# Patient Record
Sex: Male | Born: 1955 | Race: White | Hispanic: No | Smoking: Former smoker
Health system: Southern US, Community
[De-identification: ages and names within clinical notes are randomized; demographics above are authoritative.]

## PROBLEM LIST (undated history)

## (undated) DIAGNOSIS — E119 Type 2 diabetes mellitus without complications: Secondary | ICD-10-CM

## (undated) DIAGNOSIS — R17 Unspecified jaundice: Secondary | ICD-10-CM

## (undated) DIAGNOSIS — R600 Localized edema: Secondary | ICD-10-CM

## (undated) HISTORY — DX: Unspecified jaundice: R17

## (undated) HISTORY — DX: Type 2 diabetes mellitus without complications: E11.9

## (undated) HISTORY — DX: Localized edema: R60.0

## (undated) HISTORY — PX: OTHER SURGICAL HISTORY: SHX169

---

## 2016-05-21 ENCOUNTER — Encounter: Payer: Self-pay | Admitting: Family Medicine

## 2016-05-21 ENCOUNTER — Ambulatory Visit: Payer: Self-pay | Admitting: Family Medicine

## 2016-05-21 ENCOUNTER — Ambulatory Visit (INDEPENDENT_AMBULATORY_CARE_PROVIDER_SITE_OTHER): Payer: Self-pay | Admitting: Family Medicine

## 2016-05-21 VITALS — BP 168/100 | HR 115 | Temp 98.3°F | Ht 68.0 in | Wt 201.2 lb

## 2016-05-21 DIAGNOSIS — R7309 Other abnormal glucose: Secondary | ICD-10-CM

## 2016-05-21 DIAGNOSIS — I10 Essential (primary) hypertension: Secondary | ICD-10-CM

## 2016-05-21 DIAGNOSIS — E1121 Type 2 diabetes mellitus with diabetic nephropathy: Secondary | ICD-10-CM

## 2016-05-21 DIAGNOSIS — F1020 Alcohol dependence, uncomplicated: Secondary | ICD-10-CM

## 2016-05-21 DIAGNOSIS — E1165 Type 2 diabetes mellitus with hyperglycemia: Secondary | ICD-10-CM

## 2016-05-21 DIAGNOSIS — IMO0002 Reserved for concepts with insufficient information to code with codable children: Secondary | ICD-10-CM

## 2016-05-21 DIAGNOSIS — R000 Tachycardia, unspecified: Secondary | ICD-10-CM

## 2016-05-21 LAB — URINALYSIS
Bilirubin, UA: NEGATIVE
Glucose, UA: NEGATIVE
LEUKOCYTES UA: NEGATIVE
Nitrite, UA: NEGATIVE
PH UA: 5.5 (ref 5.0–7.5)
Specific Gravity, UA: 1.02 (ref 1.005–1.030)
Urobilinogen, Ur: 1 mg/dL (ref 0.2–1.0)

## 2016-05-21 LAB — BAYER DCA HB A1C WAIVED: HB A1C (BAYER DCA - WAIVED): 8.4 % — ABNORMAL HIGH (ref <7.0)

## 2016-05-21 MED ORDER — METOPROLOL SUCCINATE ER 50 MG PO TB24: 50.0000 mg | ORAL_TABLET | Freq: Every day | ORAL | 3 refills | Status: DC

## 2016-05-21 MED ORDER — METFORMIN HCL 500 MG PO TABS: 500.0000 mg | ORAL_TABLET | Freq: Two times a day (BID) | ORAL | 3 refills | Status: DC

## 2016-05-21 MED ORDER — GLYBURIDE 2.5 MG PO TABS: 2.5000 mg | ORAL_TABLET | Freq: Every day | ORAL | 2 refills | Status: DC
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
## 2016-05-21 NOTE — Patient Instructions (Signed)

## 2016-05-22 ENCOUNTER — Other Ambulatory Visit: Payer: Self-pay | Admitting: Family Medicine

## 2016-05-22 LAB — CMP14+EGFR
A/G RATIO: 1.7 (ref 1.2–2.2)
ALBUMIN: 4.3 g/dL (ref 3.5–5.5)
ALK PHOS: 113 IU/L (ref 39–117)
ALT: 30 IU/L (ref 0–44)
AST: 24 IU/L (ref 0–40)
BUN / CREAT RATIO: 8 — AB (ref 9–20)
BUN: 8 mg/dL (ref 6–24)
Bilirubin Total: 4.5 mg/dL — ABNORMAL HIGH (ref 0.0–1.2)
CALCIUM: 9.7 mg/dL (ref 8.7–10.2)
CO2: 23 mmol/L (ref 18–29)
CREATININE: 1.03 mg/dL (ref 0.76–1.27)
Chloride: 95 mmol/L — ABNORMAL LOW (ref 96–106)
GFR calc Af Amer: 92 mL/min/{1.73_m2} (ref >59)
GFR, EST NON AFRICAN AMERICAN: 80 mL/min/{1.73_m2} (ref >59)
GLOBULIN, TOTAL: 2.6 g/dL (ref 1.5–4.5)
Glucose: 136 mg/dL — ABNORMAL HIGH (ref 65–99)
POTASSIUM: 5.3 mmol/L — AB (ref 3.5–5.2)
SODIUM: 140 mmol/L (ref 134–144)
Total Protein: 6.9 g/dL (ref 6.0–8.5)

## 2016-05-22 LAB — CBC WITH DIFFERENTIAL/PLATELET
BASOS: 1 %
Basophils Absolute: 0.1 10*3/uL (ref 0.0–0.2)
EOS (ABSOLUTE): 0.1 10*3/uL (ref 0.0–0.4)
EOS: 1 %
HEMATOCRIT: 49.7 % (ref 37.5–51.0)
HEMOGLOBIN: 17.4 g/dL (ref 12.6–17.7)
Immature Grans (Abs): 0 10*3/uL (ref 0.0–0.1)
Immature Granulocytes: 0 %
LYMPHS ABS: 3 10*3/uL (ref 0.7–3.1)
Lymphs: 29 %
MCH: 32.4 pg (ref 26.6–33.0)
MCHC: 35 g/dL (ref 31.5–35.7)
MCV: 93 fL (ref 79–97)
MONOCYTES: 9 %
MONOS ABS: 0.9 10*3/uL (ref 0.1–0.9)
Neutrophils Absolute: 6.3 10*3/uL (ref 1.4–7.0)
Neutrophils: 60 %
Platelets: 258 10*3/uL (ref 150–379)
RBC: 5.37 x10E6/uL (ref 4.14–5.80)
RDW: 13.3 % (ref 12.3–15.4)
WBC: 10.3 10*3/uL (ref 3.4–10.8)

## 2016-05-26 ENCOUNTER — Telehealth: Payer: Self-pay | Admitting: Family Medicine

## 2016-05-26 NOTE — Telephone Encounter (Signed)
Patient aware and orders placed.    

## 2016-05-28 ENCOUNTER — Telehealth: Payer: Self-pay | Admitting: Family Medicine

## 2016-05-28 NOTE — Telephone Encounter (Signed)
He should check his blood sugar levels when his hands are tingling, it can be a sign of either low or high blood sugar levels. The numbers he mentioned (103, 118) were good, sometimes when the body is used to higher blood sugar levels you can have symptoms of lower blood sugar level such as the tingling. I think it is fine for him to continue his medications for now, if the tingling is getting worse he should let us know. I will forward to his PCP Dr. Darlyn ReadStacks who is out today who may have other thoughts.

## 2016-05-28 NOTE — Telephone Encounter (Signed)
Patient reports that his blood sugars have been good, 118 yesterday, 103 this morning.  The swelling in his legs has gotten better, still some swelling in feet.  He is concerned because he is experiencing tingling in both hands and is worried one of the medications may be causing this.  He states he is hesitant to take the medicines because of this and would like to know what is recommended, please advise.

## 2016-05-28 NOTE — Telephone Encounter (Signed)
Explained to patient that one of his liver enzymes were elevated and that Dr. Darlyn ReadStacks recommended the ultrasound to look at the liver and make sure it was okay.

## 2016-05-28 NOTE — Telephone Encounter (Signed)
Spoke with pt regarding Dr Vincent's recommendation Pt verbalizes understanding

## 2016-05-30 ENCOUNTER — Ambulatory Visit (HOSPITAL_COMMUNITY): Payer: Self-pay

## 2016-06-01 ENCOUNTER — Encounter: Payer: Self-pay | Admitting: Family Medicine

## 2016-06-01 ENCOUNTER — Telehealth: Payer: Self-pay | Admitting: Family Medicine

## 2016-06-01 ENCOUNTER — Other Ambulatory Visit: Payer: Self-pay | Admitting: *Deleted

## 2016-06-01 ENCOUNTER — Other Ambulatory Visit: Payer: Self-pay | Admitting: Family Medicine

## 2016-06-01 ENCOUNTER — Ambulatory Visit (HOSPITAL_COMMUNITY)
Admission: RE | Admit: 2016-06-01 | Discharge: 2016-06-01 | Disposition: A | Discharge status: Home / Self Care | Payer: Self-pay | Source: Ambulatory Visit | Attending: Family Medicine | Admitting: Family Medicine

## 2016-06-01 ENCOUNTER — Ambulatory Visit (INDEPENDENT_AMBULATORY_CARE_PROVIDER_SITE_OTHER): Payer: Self-pay | Admitting: Family Medicine

## 2016-06-01 VITALS — BP 138/93 | HR 95 | Temp 97.5°F | Ht 68.0 in | Wt 205.2 lb

## 2016-06-01 DIAGNOSIS — K8001 Calculus of gallbladder with acute cholecystitis with obstruction: Secondary | ICD-10-CM

## 2016-06-01 DIAGNOSIS — R188 Other ascites: Secondary | ICD-10-CM | POA: Insufficient documentation

## 2016-06-01 DIAGNOSIS — K8020 Calculus of gallbladder without cholecystitis without obstruction: Secondary | ICD-10-CM | POA: Insufficient documentation

## 2016-06-01 DIAGNOSIS — IMO0002 Reserved for concepts with insufficient information to code with codable children: Secondary | ICD-10-CM

## 2016-06-01 DIAGNOSIS — E1165 Type 2 diabetes mellitus with hyperglycemia: Secondary | ICD-10-CM

## 2016-06-01 DIAGNOSIS — J0100 Acute maxillary sinusitis, unspecified: Secondary | ICD-10-CM

## 2016-06-01 DIAGNOSIS — J90 Pleural effusion, not elsewhere classified: Secondary | ICD-10-CM | POA: Insufficient documentation

## 2016-06-01 DIAGNOSIS — E1121 Type 2 diabetes mellitus with diabetic nephropathy: Secondary | ICD-10-CM

## 2016-06-01 MED ORDER — AMOXICILLIN-POT CLAVULANATE 875-125 MG PO TABS: 1.0000 | ORAL_TABLET | Freq: Two times a day (BID) | ORAL | 0 refills | Status: DC

## 2016-06-01 NOTE — Progress Notes (Signed)
Subjective:  Patient ID: Shawn Malone, male    DOB: 12-Jul-1956  Age: 60 y.o. MRN: [id number]  CC: Diabetes (pt here today for a 2 week recheck, he had ultrasound earlier this morning)   HPI Shawn Sihomas Gidley presents for recheck of the diabetes. Had diarrhea for three days after starting metformin. Now doing well. Still having some abd discomfort. Ultrasound showed gallstones with some thickening of the gall bladder wall.  Symptoms include congestion, facial pain, nasal congestion, no  fever, non productive cough, post nasal drip and sinus pressure with no fever, chills, night sweats or weight loss. Onset of symptoms was a few days ago, gradually worsening since that time. Pt.is drinking moderate amounts of fluids.      History Shawn Malone has no past medical history on file.   He has no past surgical history on file.   His family history includes Cancer in his brother and mother.He reports that he quit smoking about 33 years ago. He has never used smokeless tobacco. He reports that he drinks about 18.0 oz of alcohol per week . He reports that he does not use drugs.    ROS Review of Systems  Constitutional: Negative for chills, diaphoresis and fever.  HENT: Negative for rhinorrhea and sore throat.   Respiratory: Negative for cough and shortness of breath.   Cardiovascular: Negative for chest pain.  Gastrointestinal: Negative for abdominal pain.  Musculoskeletal: Negative for arthralgias and myalgias.  Skin: Negative for rash.  Neurological: Negative for weakness and headaches.    Objective:  BP (!) 138/93   Pulse 95   Temp 97.5 F (36.4 C) (Oral)   Ht 5\' 8"  (1.727 m)   Wt 205 lb 4 oz (93.1 kg)   BMI 31.21 kg/m   BP Readings from Last 3 Encounters:  06/01/16 (!) 138/93  05/21/16 (!) 168/100    Wt Readings from Last 3 Encounters:  06/01/16 205 lb 4 oz (93.1 kg)  05/21/16 201 lb 4 oz (91.3 kg)     Physical Exam  Constitutional: He appears well-developed and well-nourished.   HENT:  Head: Normocephalic and atraumatic.  Right Ear: Tympanic membrane and external ear normal. No decreased hearing is noted.  Left Ear: Tympanic membrane and external ear normal. No decreased hearing is noted.  Mouth/Throat: No oropharyngeal exudate or posterior oropharyngeal erythema.  Eyes: Pupils are equal, round, and reactive to light.  Neck: Normal range of motion. Neck supple.  Cardiovascular: Normal rate and regular rhythm.   No murmur heard. Pulmonary/Chest: Breath sounds normal. No respiratory distress.  Abdominal: Soft. Bowel sounds are normal. He exhibits no mass. There is no tenderness.  Vitals reviewed.    Lab Results  Component Value Date   WBC 10.3 05/21/2016   HCT 49.7 05/21/2016   PLT 258 05/21/2016   GLUCOSE 136 (H) 05/21/2016   ALT 30 05/21/2016   AST 24 05/21/2016   NA 140 05/21/2016   K 5.3 (H) 05/21/2016   CL 95 (L) 05/21/2016   CREATININE 1.03 05/21/2016   BUN 8 05/21/2016   CO2 23 05/21/2016    [place] Abdomen Limited Ruq  Result Date: 06/01/2016 CLINICAL DATA:  Hyperbilirubinemia EXAM: US ABDOMEN LIMITED - RIGHT UPPER QUADRANT COMPARISON:  None. FINDINGS: Gallbladder: Within the gallbladder, there are echogenic foci which move and shadow consistent with gallstones. Largest individual gallstone measures 8 mm in an length. There is mild gallbladder wall thickening without pericholecystic fluid. No sonographic Murphy sign noted by sonographer. Common bile duct: Diameter: 2 mm. There  is no intrahepatic or extrahepatic biliary duct dilatation. Liver: No focal lesion identified. Within normal limits in parenchymal echogenicity. Ascites is noted adjacent to the liver. There is a right pleural effusion. IMPRESSION: Cholelithiasis. There is gallbladder wall thickening. Gallbladder wall thickening may be seen with ascites, which is present. It also may be seen with early cholecystitis. Given this circumstance, it may be prudent to correlate with nuclear medicine  hepatobiliary imaging study to assess for cystic duct patency. Ascites and right pleural effusion noted. Electronically Signed   By: Bretta BangWilliam  Woodruff III M.D.   On: 06/01/2016 09:02    Assessment & Plan:   Shawn Malone was seen today for diabetes.  Diagnoses and all orders for this visit:  Uncontrolled type 2 diabetes mellitus with diabetic nephropathy, without long-term current use of insulin (HCC)  Calculus of gallbladder with acute cholecystitis and obstruction -     Ambulatory referral to General Surgery  Acute maxillary sinusitis, recurrence not specified  Other orders -     amoxicillin-clavulanate (AUGMENTIN) 875-125 MG tablet; Take 1 tablet by mouth 2 (two) times daily. Take all of this medication    I am having Mr. Clementeen GrahamCurry start on amoxicillin-clavulanate. I am also having him maintain his aspirin, metoprolol succinate, metFORMIN, and glyBURIDE.  Meds ordered this encounter  Medications  . amoxicillin-clavulanate (AUGMENTIN) 875-125 MG tablet    Sig: Take 1 tablet by mouth 2 (two) times daily. Take all of this medication    Dispense:  20 tablet    Refill:  0     Follow-up: Return in about 3 months (around 09/01/2016).  Mechele ClaudeWarren Ajamu Maxon, M.D.

## 2016-06-02 ENCOUNTER — Telehealth: Payer: Self-pay | Admitting: Family Medicine

## 2016-06-02 NOTE — Telephone Encounter (Signed)
Per pt Dr Darlyn ReadStacks told pt he was going to start pt on fluid pill and something for sleep, nothing was mentioned about putting pt on antibiotic Antibiotic was sent into pharmacy, but no fluid pill or medication for sleep Please review and advise

## 2016-06-04 ENCOUNTER — Other Ambulatory Visit: Payer: Self-pay | Admitting: Family Medicine

## 2016-06-04 MED ORDER — SPIRONOLACTONE 25 MG PO TABS: 25.0000 mg | ORAL_TABLET | Freq: Every day | ORAL | 2 refills | Status: DC

## 2016-06-04 MED ORDER — TRAZODONE HCL 150 MG PO TABS: ORAL_TABLET | ORAL | 5 refills | Status: DC

## 2016-06-04 NOTE — Telephone Encounter (Signed)
Patient was notified that Trazodone and Spironolactone were sent in to his pharmacy.  He was also instructed that the antibiotic was sent in for a sinusitis.  Patient voices understanding.

## 2016-06-04 NOTE — Telephone Encounter (Signed)
Notified of RXs Verbalizes understanding

## 2016-06-04 NOTE — Telephone Encounter (Signed)
I sent in the requested prescription 

## 2016-06-11 ENCOUNTER — Other Ambulatory Visit: Payer: Self-pay | Admitting: *Deleted

## 2016-06-11 DIAGNOSIS — IMO0002 Reserved for concepts with insufficient information to code with codable children: Secondary | ICD-10-CM

## 2016-06-11 DIAGNOSIS — E1121 Type 2 diabetes mellitus with diabetic nephropathy: Secondary | ICD-10-CM

## 2016-06-11 DIAGNOSIS — E1165 Type 2 diabetes mellitus with hyperglycemia: Principal | ICD-10-CM

## 2016-06-11 MED ORDER — GLYBURIDE 2.5 MG PO TABS: 2.5000 mg | ORAL_TABLET | Freq: Every day | ORAL | 1 refills | Status: DC

## 2016-07-24 ENCOUNTER — Encounter (INDEPENDENT_AMBULATORY_CARE_PROVIDER_SITE_OTHER): Payer: Self-pay

## 2016-07-24 ENCOUNTER — Encounter: Payer: Self-pay | Admitting: Physician Assistant

## 2016-07-24 ENCOUNTER — Ambulatory Visit (INDEPENDENT_AMBULATORY_CARE_PROVIDER_SITE_OTHER): Payer: Self-pay | Admitting: Physician Assistant

## 2016-07-24 VITALS — BP 142/86 | HR 101 | Temp 96.8°F | Ht 68.0 in | Wt 200.4 lb

## 2016-07-24 DIAGNOSIS — K8000 Calculus of gallbladder with acute cholecystitis without obstruction: Secondary | ICD-10-CM

## 2016-07-24 DIAGNOSIS — R188 Other ascites: Secondary | ICD-10-CM

## 2016-07-24 DIAGNOSIS — R748 Abnormal levels of other serum enzymes: Secondary | ICD-10-CM

## 2016-07-24 MED ORDER — FUROSEMIDE 20 MG PO TABS: 20.0000 mg | ORAL_TABLET | Freq: Every day | ORAL | 3 refills | Status: DC

## 2016-07-24 NOTE — Patient Instructions (Signed)
Carbohydrate Counting for Diabetes Mellitus, Adult Carbohydrate counting is a method for keeping track of how many carbohydrates you eat. Eating carbohydrates naturally increases the amount of sugar (glucose) in the blood. Counting how many carbohydrates you eat helps keep your blood glucose within normal limits, which helps you manage your diabetes (diabetes mellitus). It is important to know how many carbohydrates you can safely have in each meal. This is different for every person. A diet and nutrition specialist (registered dietitian) can help you make a meal plan and calculate how many carbohydrates you should have at each meal and snack. Carbohydrates are found in the following foods:  Grains, such as breads and cereals.  Dried beans and soy products.  Starchy vegetables, such as potatoes, peas, and corn.  Fruit and fruit juices.  Milk and yogurt.  Sweets and snack foods, such as cake, cookies, candy, chips, and soft drinks. How do I count carbohydrates? There are two ways to count carbohydrates in food. You can use either of the methods or a combination of both. Reading "Nutrition Facts" on packaged food  The "Nutrition Facts" list is included on the labels of almost all packaged foods and beverages in the [place] It includes:  The serving size.  Information about nutrients in each serving, including the grams (g) of carbohydrate per serving. To use the "Nutrition Facts":  Decide how many servings you will have.  Multiply the number of servings by the number of carbohydrates per serving.  The resulting number is the total amount of carbohydrates that you will be having. Learning standard serving sizes of other foods  When you eat foods containing carbohydrates that are not packaged or do not include "Nutrition Facts" on the label, you need to measure the servings in order to count the amount of carbohydrates:  Measure the foods that you will eat with a food scale or measuring  cup, if needed.  Decide how many standard-size servings you will eat.  Multiply the number of servings by 15. Most carbohydrate-rich foods have about 15 g of carbohydrates per serving.  For example, if you eat 8 oz (170 g) of strawberries, you will have eaten 2 servings and 30 g of carbohydrates (2 servings x 15 g = 30 g).  For foods that have more than one food mixed, such as soups and casseroles, you must count the carbohydrates in each food that is included. The following list contains standard serving sizes of common carbohydrate-rich foods. Each of these servings has about 15 g of carbohydrates:   hamburger bun or  English muffin.   oz (15 mL) syrup.   oz (14 g) jelly.  1 slice of bread.  1 six-inch tortilla.  3 oz (85 g) cooked rice or pasta.  4 oz (113 g) cooked dried beans.  4 oz (113 g) starchy vegetable, such as peas, corn, or potatoes.  4 oz (113 g) hot cereal.  4 oz (113 g) mashed potatoes or  of a large baked potato.  4 oz (113 g) canned or frozen fruit.  4 oz (120 mL) fruit juice.  4-6 crackers.  6 chicken nuggets.  6 oz (170 g) unsweetened dry cereal.  6 oz (170 g) plain fat-free yogurt or yogurt sweetened with artificial sweeteners.  8 oz (240 mL) milk.  8 oz (170 g) fresh fruit or one small piece of fruit.  24 oz (680 g) popped popcorn. Example of carbohydrate counting Sample meal  3 oz (85 g) chicken breast.  6 oz (  170 g) brown rice.  4 oz (113 g) corn.  8 oz (240 mL) milk.  8 oz (170 g) strawberries with sugar-free whipped topping. Carbohydrate calculation 1. Identify the foods that contain carbohydrates:  Rice.  Corn.  Milk.  Strawberries. 2. Calculate how many servings you have of each food:  2 servings rice.  1 serving corn.  1 serving milk.  1 serving strawberries. 3. Multiply each number of servings by 15 g:  2 servings rice x 15 g = 30 g.  1 serving corn x 15 g = 15 g.  1 serving milk x 15 g = 15  g.  1 serving strawberries x 15 g = 15 g. 4. Add together all of the amounts to find the total grams of carbohydrates eaten:  30 g + 15 g + 15 g + 15 g = 75 g of carbohydrates total. This information is not intended to replace advice given to you by your health care provider. Make sure you discuss any questions you have with your health care provider. Document Released: 07/16/2005 Document Revised: 02/03/2016 Document Reviewed: 12/28/2015 [employer] Interactive Patient Education  2017 [employer]  

## 2016-07-24 NOTE — Progress Notes (Signed)
BP (!) 152/101   Pulse (!) 101   Temp (!) 96.8 F (36 C) (Oral)   Ht '5\' 8"'$  (1.727 m)   Wt 200 lb 6.4 oz (90.9 kg)   BMI 30.47 kg/m    Subjective:    Patient ID: Shawn Malone, male    DOB: 1955/11/30, 60 y.o.   MRN: [id number]  HPI: Shawn Malone is a 60 y.o. male presenting on 07/24/2016 for Leg Swelling (started in feet (bilateral) going up into thighs, having tingling too)  In late October the patient was found to have a elevated bilirubin and was feeling poorly. He had been diagnoses diabetic. Medications were started. Abdominal ultrasound was ordered and there was found to be cholelithiasis with possible associated cholecystitis. There was fluid around the area of the gallbladder. According to the ultrasound the biliary tree was clear at this time. The patient has continued with edema that has gone up his legs and he is having a total tight abdomen at times. He states that when he missed the metformin last night he felt that his abdomen was less tight. Have him hold his metformin for now. He does have some jaundice appearance to around his eyes and of his sclera. We'll plan for gastroenterology evaluation. He was referred to a general surgeon and that they declined to perform a surgery at this time. He states he has occasional nausea. He has not had any severe right upper quadrant pain. He denies any fever. A warning is given that if anything gets worse he develops fever, chills, nausea or vomiting to report to the [facility] emergency room as soon as possible. I have given him information is possible to develop an infection in this area.  Relevant past medical, surgical, family and social history reviewed and updated as indicated. Allergies and medications reviewed and updated.  Past Medical History:  Diagnosis Date  . Diabetes mellitus without complication ([place])     History reviewed. No pertinent surgical history.  Review of Systems  Constitutional: Positive for fatigue. Negative  for appetite change and fever.  HENT: Negative.   Eyes: Negative.  Negative for pain and visual disturbance.  Respiratory: Positive for shortness of breath. Negative for cough, chest tightness and wheezing.   Cardiovascular: Negative.  Negative for chest pain, palpitations and leg swelling.  Gastrointestinal: Positive for abdominal distention. Negative for abdominal pain, blood in stool, constipation, diarrhea, nausea and vomiting.  Endocrine: Negative.   Genitourinary: Negative.   Musculoskeletal: Negative.   Skin: Positive for color change. Negative for rash.  Neurological: Negative.  Negative for weakness, numbness and headaches.  Psychiatric/Behavioral: Negative.     Allergies as of 07/24/2016   No Known Allergies     Medication List       Accurate as of 07/24/16  3:50 PM. Always use your most recent med list.          aspirin 325 MG EC tablet Take 325 mg by mouth daily.   furosemide 20 MG tablet Commonly known as:  LASIX Take 1 tablet (20 mg total) by mouth daily. Take 5-7 days for fluid   glyBURIDE 2.5 MG tablet Commonly known as:  DIABETA Take 1 tablet (2.5 mg total) by mouth daily with breakfast.   metoprolol succinate 50 MG 24 hr tablet Commonly known as:  TOPROL-XL Take 1 tablet (50 mg total) by mouth daily. Take with or immediately following a meal.   spironolactone 25 MG tablet Commonly known as:  ALDACTONE Take 1 tablet (25 mg  total) by mouth daily. For fluid   traZODone 150 MG tablet Commonly known as:  DESYREL Use from 1/3 to 1 tablet nightly as needed for sleep.          Objective:    BP (!) 152/101   Pulse (!) 101   Temp (!) 96.8 F (36 C) (Oral)   Ht '5\' 8"'$  (1.727 m)   Wt 200 lb 6.4 oz (90.9 kg)   BMI 30.47 kg/m   No Known Allergies  Physical Exam  Constitutional: He appears well-developed and well-nourished.  HENT:  Head: Normocephalic and atraumatic.  Eyes: Conjunctivae and EOM are normal. Pupils are equal, round, and reactive to  light.  Neck: Normal range of motion. Neck supple.  Cardiovascular: Normal rate, regular rhythm and normal heart sounds.   3+ pitting edema in lower extremities. Skin is normal color in the extremities and cool and dry to the touch.  Pulmonary/Chest: Effort normal and breath sounds normal.  Abdominal: Soft. Bowel sounds are normal.  Musculoskeletal: Normal range of motion.  Skin: Skin is warm and dry.    Results for orders placed or performed in visit on 05/21/16  Bayer DCA Hb A1c Waived  Result Value Ref Range   Bayer DCA Hb A1c Waived 8.4 (H) <7.0 %  Urinalysis  Result Value Ref Range   Specific Gravity, UA 1.020 1.005 - 1.030   pH, UA 5.5 5.0 - 7.5   Color, UA Yellow Yellow   Appearance Ur Clear Clear   Leukocytes, UA Negative Negative   Protein, UA 3+ (A) Negative/Trace   Glucose, UA Negative Negative   Ketones, UA 2+ (A) Negative   RBC, UA Trace (A) Negative   Bilirubin, UA Negative Negative   Urobilinogen, Ur 1.0 0.2 - 1.0 mg/dL   Nitrite, UA Negative Negative  CBC with Differential/Platelet  Result Value Ref Range   WBC 10.3 3.4 - 10.8 x10E3/uL   RBC 5.37 4.14 - 5.80 x10E6/uL   Hemoglobin 17.4 12.6 - 17.7 g/dL   Hematocrit 49.7 37.5 - 51.0 %   MCV 93 79 - 97 fL   MCH 32.4 26.6 - 33.0 pg   MCHC 35.0 31.5 - 35.7 g/dL   RDW 13.3 12.3 - 15.4 %   Platelets 258 150 - 379 x10E3/uL   Neutrophils 60 Not Estab. %   Lymphs 29 Not Estab. %   Monocytes 9 Not Estab. %   Eos 1 Not Estab. %   Basos 1 Not Estab. %   Neutrophils Absolute 6.3 1.4 - 7.0 x10E3/uL   Lymphocytes Absolute 3.0 0.7 - 3.1 x10E3/uL   Monocytes Absolute 0.9 0.1 - 0.9 x10E3/uL   EOS (ABSOLUTE) 0.1 0.0 - 0.4 x10E3/uL   Basophils Absolute 0.1 0.0 - 0.2 x10E3/uL   Immature Granulocytes 0 Not Estab. %   Immature Grans (Abs) 0.0 0.0 - 0.1 x10E3/uL  CMP14+EGFR  Result Value Ref Range   Glucose 136 (H) 65 - 99 mg/dL   BUN 8 6 - 24 mg/dL   Creatinine, Ser 1.03 0.76 - 1.27 mg/dL   GFR calc non Af Amer 80 >59  mL/min/1.73   GFR calc Af Amer 92 >59 mL/min/1.73   BUN/Creatinine Ratio 8 (L) 9 - 20   Sodium 140 134 - 144 mmol/L   Potassium 5.3 (H) 3.5 - 5.2 mmol/L   Chloride 95 (L) 96 - 106 mmol/L   CO2 23 18 - 29 mmol/L   Calcium 9.7 8.7 - 10.2 mg/dL   Total Protein 6.9 6.0 -  8.5 g/dL   Albumin 4.3 3.5 - 5.5 g/dL   Globulin, Total 2.6 1.5 - 4.5 g/dL   Albumin/Globulin Ratio 1.7 1.2 - 2.2   Bilirubin Total 4.5 (H) 0.0 - 1.2 mg/dL   Alkaline Phosphatase 113 39 - 117 IU/L   AST 24 0 - 40 IU/L   ALT 30 0 - 44 IU/L      Assessment & Plan:   1. Abnormal liver enzymes/bilirubin with jaundice - Ambulatory referral to Gastroenterology - Amylase; Future - Lipase, blood; Future - CBC with Differential/Platelet; Future - Comprehensive metabolic panel; Future  2. Other ascites - Ambulatory referral to Gastroenterology  3. Calculus of gallbladder with acute cholecystitis without obstruction - Ambulatory referral to Gastroenterology  4. Side effects metformin Hold metformin for now.  See how he feels after 5 days.    Continue all other maintenance medications as listed above.  Follow up plan: Return in about 4 weeks (around 08/21/2016) for rechceck.  Orders Placed This Encounter  Procedures  . Amylase  . Lipase, blood  . CBC with Differential/Platelet  . Comprehensive metabolic panel  . Ambulatory referral to Gastroenterology    Educational handout given for carb counting  Terald Sleeper PA-C [place] [address]  [place] [address],  [address] [phone]   07/24/2016, 3:50 PM

## 2016-07-25 ENCOUNTER — Other Ambulatory Visit (HOSPITAL_COMMUNITY)
Admission: RE | Admit: 2016-07-25 | Discharge: 2016-07-25 | Disposition: A | Discharge status: Home / Self Care | Payer: Self-pay | Source: Ambulatory Visit | Attending: Physician Assistant | Admitting: Physician Assistant

## 2016-07-25 DIAGNOSIS — R748 Abnormal levels of other serum enzymes: Secondary | ICD-10-CM | POA: Insufficient documentation

## 2016-07-25 LAB — COMPREHENSIVE METABOLIC PANEL
ALBUMIN: 4.5 g/dL (ref 3.5–5.0)
ALK PHOS: 61 U/L (ref 38–126)
ALT: 20 U/L (ref 17–63)
AST: 21 U/L (ref 15–41)
Anion gap: 7 (ref 5–15)
BILIRUBIN TOTAL: 4 mg/dL — AB (ref 0.3–1.2)
BUN: 12 mg/dL (ref 6–20)
CO2: 28 mmol/L (ref 22–32)
Calcium: 9.5 mg/dL (ref 8.9–10.3)
Chloride: 99 mmol/L — ABNORMAL LOW (ref 101–111)
Creatinine, Ser: 0.96 mg/dL (ref 0.61–1.24)
GFR calc Af Amer: >60 mL/min (ref >60)
GFR calc non Af Amer: >60 mL/min (ref >60)
GLUCOSE: 179 mg/dL — AB (ref 65–99)
POTASSIUM: 4.1 mmol/L (ref 3.5–5.1)
Sodium: 134 mmol/L — ABNORMAL LOW (ref 135–145)
TOTAL PROTEIN: 7.2 g/dL (ref 6.5–8.1)

## 2016-07-25 LAB — CBC WITH DIFFERENTIAL/PLATELET
BASOS ABS: 0.1 10*3/uL (ref 0.0–0.1)
Basophils Relative: 1 %
EOS PCT: 1 %
Eosinophils Absolute: 0.1 10*3/uL (ref 0.0–0.7)
HCT: 45.4 % (ref 39.0–52.0)
Hemoglobin: 15.2 g/dL (ref 13.0–17.0)
LYMPHS PCT: 28 %
Lymphs Abs: 2.4 10*3/uL (ref 0.7–4.0)
MCH: 31.1 pg (ref 26.0–34.0)
MCHC: 33.5 g/dL (ref 30.0–36.0)
MCV: 92.8 fL (ref 78.0–100.0)
MONO ABS: 0.6 10*3/uL (ref 0.1–1.0)
MONOS PCT: 7 %
Neutro Abs: 5.4 10*3/uL (ref 1.7–7.7)
Neutrophils Relative %: 63 %
Platelets: 216 10*3/uL (ref 150–400)
RBC: 4.89 MIL/uL (ref 4.22–5.81)
RDW: 12.9 % (ref 11.5–15.5)
WBC: 8.5 10*3/uL (ref 4.0–10.5)

## 2016-07-25 LAB — AMYLASE: Amylase: 37 U/L (ref 28–100)

## 2016-07-25 LAB — LIPASE, BLOOD: Lipase: 29 U/L (ref 11–51)

## 2016-07-26 ENCOUNTER — Encounter: Payer: Self-pay | Admitting: Internal Medicine

## 2016-08-10 ENCOUNTER — Ambulatory Visit (INDEPENDENT_AMBULATORY_CARE_PROVIDER_SITE_OTHER): Payer: Self-pay | Admitting: Nurse Practitioner

## 2016-08-10 ENCOUNTER — Encounter: Payer: Self-pay | Admitting: Nurse Practitioner

## 2016-08-10 DIAGNOSIS — R7989 Other specified abnormal findings of blood chemistry: Secondary | ICD-10-CM

## 2016-08-10 DIAGNOSIS — R945 Abnormal results of liver function studies: Secondary | ICD-10-CM

## 2016-08-10 DIAGNOSIS — K8020 Calculus of gallbladder without cholecystitis without obstruction: Secondary | ICD-10-CM | POA: Insufficient documentation

## 2016-08-10 DIAGNOSIS — F1010 Alcohol abuse, uncomplicated: Secondary | ICD-10-CM | POA: Insufficient documentation

## 2016-08-10 NOTE — Patient Instructions (Signed)
1. Have your labs drawn at the hospital. You can walk-this done. 2. We will schedule your CAT scan for you. 3. Avoid taking any alcohol. 4. Avoid taking Tylenol. 5. If he take ibuprofen, he is a cautiously and only take it per the instructions on the bottle. 6. Return for follow-up in 4 weeks.

## 2016-08-10 NOTE — Progress Notes (Signed)
Primary Care Physician:  Mechele ClaudeSTACKS,WARREN, MD Primary Gastroenterologist:  Dr. Jena Gaussourk  Chief Complaint  Patient presents with  . Abnormal LFT'S    HPI:   Shawn Malone is a 61 y.o. male who presents On referral from primary care for abnormal liver enzymes, ascites, gallstone. PCP notes reviewed. The patient was seen in late October 2017 and found to have elevated bilirubin and feeling poorly. He was newly diagnosed diabetes. Abdominal ultrasound found cholelithiasis with possible cholecystitis and fluid around the gallbladder with a clear biliary tree. He is also having bilateral lower extremity edema and some jaundiced appearance via scleral icterus. He was started on metformin and felt his symptoms and edema were worse when taking metformin. He was seen by a general surgeon he declined to do surgery due to the asymptomatic nature of his gallstones.   Most recent labs completed 07/25/2016 and found a bilirubin of 4.0 which is decreased from 4.5 in October, normal AST/ALT, normal alkaline phosphatase, normal lipase, normal amylase, CBC completely normal including platelet count. Abdominal ultrasound reviewed which was completed on 06/01/2016 and found intra-gallbladder echogenic foci consistent with gallstones with the largest measuring 8 mm with mild gallbladder wall thickening without pericholecystic fluid but noted that gallbladder wall thickening may be seen with ascites. Common bile duct diameter 2 mm without ductal dilation, liver with no focal lesion and normal parenchymal echogenicity. Ascites adjacent to the liver as well as a pleural effusion noted. Recommended consider HIDA scan to assess for cystic duct patency.  Today he states he's doing well overall. He denies abdominal pain, N/V, hematochezia, melena, acute episodic confusion, darkened urine. Was put on metformin and was having worsening edema. He was told to stop Metformin for a week, but he hasn't started it back yet. Was placed on a  diuretic as well. Improvement in edema. Improvement in scleral icterus. Previously was drinking 6-7 beers a day. States PCP told him to drink 2 beers a day for 2 weeks then one beer a day. He has since stopped any alcohol. Denies chest pain, dyspnea, dizziness, lightheadedness, syncope, near syncope. Denies any other upper or lower GI symptoms.  Past Medical History:  Diagnosis Date  . Diabetes mellitus without complication ([facility])   . Elevated bilirubin   . Extremity edema     Past Surgical History:  Procedure Laterality Date  . None     As of 08/10/16    Current Outpatient Prescriptions  Medication Sig Dispense Refill  . aspirin 325 MG EC tablet Take 325 mg by mouth daily.    . furosemide (LASIX) 20 MG tablet Take 1 tablet (20 mg total) by mouth daily. Take 5-7 days for fluid 30 tablet 3  . glyBURIDE (DIABETA) 2.5 MG tablet Take 1 tablet (2.5 mg total) by mouth daily with breakfast. 90 tablet 1  . metoprolol succinate (TOPROL-XL) 50 MG 24 hr tablet Take 1 tablet (50 mg total) by mouth daily. Take with or immediately following a meal. 90 tablet 3  . spironolactone (ALDACTONE) 25 MG tablet Take 1 tablet (25 mg total) by mouth daily. For fluid 30 tablet 2  . traZODone (DESYREL) 150 MG tablet Use from 1/3 to 1 tablet nightly as needed for sleep. 30 tablet 5   No current facility-administered medications for this visit.     Allergies as of 08/10/2016  . (No Known Allergies)    Family History  Problem Relation Age of Onset  . Cancer Mother   . Colon cancer Brother 48  .  Lung cancer Brother   . Liver disease Neg Hx     Social History   Social History  . Marital status: Single    Spouse name: N/A  . Number of children: N/A  . Years of education: N/A   Occupational History  . Not on file.   Social History Main Topics  . Smoking status: Former Smoker    Quit date: 05/22/1983  . Smokeless tobacco: Never Used  . Alcohol use No     Comment: Currently no ETOH 08/10/16;  Previously drank significantly  . Drug use: No  . Sexual activity: Yes    Partners: Female   Other Topics Concern  . Not on file   Social History Narrative  . No narrative on file    Review of Systems: General: Negative for anorexia, weight loss, fever, chills, fatigue, weakness. ENT: Negative for hoarseness, difficulty swallowing. CV: Negative for chest pain, angina, palpitations, peripheral edema.  Respiratory: Negative for dyspnea at rest, cough, sputum, wheezing.  GI: See history of present illness. GU:  Negative for darkened urine.  Derm: Negative for rash or itching.  Neuro: Negative for memory loss, confusion.  Endo: Negative for unusual weight change.  Heme: Negative for bruising or bleeding. Allergy: Negative for rash or hives.    Physical Exam: BP 139/88   Pulse 98   Temp (!) 96.4 F (35.8 C) (Oral)   Ht 5\' 8"  (1.727 m)   Wt 182 lb 12.8 oz (82.9 kg)   BMI 27.79 kg/m  General:   Alert and oriented. Pleasant and cooperative. Well-nourished and well-developed.  Head:  Normocephalic and atraumatic. Eyes:  Without icterus, sclera clear and conjunctiva pink.  Ears:  Normal auditory acuity. Cardiovascular:  S1, S2 present without murmurs appreciated. Extremities without clubbing or edema. Respiratory:  Clear to auscultation bilaterally. No wheezes, rales, or rhonchi. No distress.  Gastrointestinal:  +BS, soft, non-tender and non-distended. No tense ascites. No splenomegaly noted. Right liver border appreciated 1 fingerbreadth below the right costal margin on deep inspiration. No guarding or rebound. No masses appreciated.  Rectal:  Deferred  Musculoskalatal:  Symmetrical without gross deformities.  Skin:  Intact without significant lesions or rashes. Neurologic:  Alert and oriented x4;  grossly normal neurologically. Psych:  Alert and cooperative. Normal mood and affect. Heme/Lymph/Immune: No excessive bruising noted.    08/10/2016 11:34 AM   Disclaimer: This  note was dictated with voice recognition software. Similar sounding words can inadvertently be transcribed and may not be corrected upon review.

## 2016-08-10 NOTE — Assessment & Plan Note (Signed)
He previously drank up to 5-7 drinks a day with the past few years. He is since reduce his alcohol intake and, as of a couple weeks ago, stopped drinking alcohol altogether. Given his history of excessive alcohol intake I will further workup his liver with a CT of the abdomen and pelvis to check for scarring or other signs of liver disease. Currently, his liver evaluation is unremarkable except for isolated hyperbilirubinemia. Return for follow-up in 4 weeks.

## 2016-08-10 NOTE — Assessment & Plan Note (Addendum)
Currently asymptomatic. Some mild gallbladder wall thickening noted which was deemed possibly due to localized ascites. On bile duct diameter normal. If he would like, after his liver workup is complete, a HIDA scan could be ordered to check for gallbladder function. Surgery at this point has declined cholecystectomy due to asymptomatic nature of his presentation.

## 2016-08-10 NOTE — Assessment & Plan Note (Signed)
The patient has had isolated hyperbilirubinemia ranging from 4.0-4.5 since October 2017. All other LFTs normal. He does have cholelithiasis without overt cholecystitis. Slight wall thickening of the gallbladder noted associated with mild ascites. He was having significant lower extremity edema. With the addition of diuretic his lower extremity edema has resolved and I anticipate his ascites would have resolved as well. Possibleother etiology of fluid retention if his liver workup is unremarkable. His gallbladder is asymptomatic at this point. Ultrasound did not show abnormal parenchymal echogenicity or suspicious lesions of the liver. Common bile duct normal.   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 I will recheck CBC as well as hepatic function panel and INR/PT. Given that he has a history of significant alcohol intake, I will also order a CT of the abdomen and pelvis with contrast to evaluate further for liver lesions and liver scarring as well as any new/recurrent ascites. He is cautioned to avoid alcohol and Tylenol. Per research patients with go Barrett's disease may have increased risk of toxic acetaminophen because of dependence of bilirubin-UGT-mediated hepatic glucuronidation prior to excretion.  Return for follow-up in 4 weeks.

## 2016-08-13 ENCOUNTER — Encounter: Payer: Self-pay | Admitting: Internal Medicine

## 2016-08-13 NOTE — Progress Notes (Signed)
cc'ed to pcp °

## 2016-08-15 ENCOUNTER — Other Ambulatory Visit (HOSPITAL_COMMUNITY): Payer: Self-pay

## 2016-08-23 ENCOUNTER — Ambulatory Visit (INDEPENDENT_AMBULATORY_CARE_PROVIDER_SITE_OTHER): Payer: Self-pay | Admitting: Physician Assistant

## 2016-08-23 ENCOUNTER — Encounter: Payer: Self-pay | Admitting: Physician Assistant

## 2016-08-23 VITALS — BP 134/89 | HR 89 | Temp 97.8°F | Ht 68.0 in | Wt 185.6 lb

## 2016-08-23 DIAGNOSIS — IMO0002 Reserved for concepts with insufficient information to code with codable children: Secondary | ICD-10-CM | POA: Insufficient documentation

## 2016-08-23 DIAGNOSIS — E119 Type 2 diabetes mellitus without complications: Secondary | ICD-10-CM

## 2016-08-23 DIAGNOSIS — G629 Polyneuropathy, unspecified: Secondary | ICD-10-CM

## 2016-08-23 DIAGNOSIS — F5101 Primary insomnia: Secondary | ICD-10-CM | POA: Insufficient documentation

## 2016-08-23 MED ORDER — AMITRIPTYLINE HCL 50 MG PO TABS: 50.0000 mg | ORAL_TABLET | Freq: Every day | ORAL | 2 refills | Status: DC

## 2016-08-23 NOTE — Patient Instructions (Signed)
Neuropathic Pain Introduction Neuropathic pain is pain caused by damage to the nerves that are responsible for certain sensations in your body (sensory nerves). The pain can be caused by damage to:  The sensory nerves that send signals to your spinal cord and brain (peripheral nervous system).  The sensory nerves in your brain or spinal cord (central nervous system). Neuropathic pain can make you more sensitive to pain. What would be a minor sensation for most people may feel very painful if you have neuropathic pain. This is usually a long-term condition that can be difficult to treat. The type of pain can differ from person to person. It may start suddenly (acute), or it may develop slowly and last for a long time (chronic). Neuropathic pain may come and go as damaged nerves heal or may stay at the same level for years. It often causes emotional distress, loss of sleep, and a lower quality of life. What are the causes? The most common cause of damage to a sensory nerve is diabetes. Many other diseases and conditions can also cause neuropathic pain. Causes of neuropathic pain can be classified as:  Toxic. Many drugs and chemicals can cause toxic damage. The most common cause of toxic neuropathic pain is damage from drug treatment for cancer (chemotherapy).  Metabolic. This type of pain can happen when a disease causes imbalances that damage nerves. Diabetes is the most common of these diseases. Vitamin B deficiency caused by long-term alcohol abuse is another common cause.  Traumatic. Any injury that cuts, crushes, or stretches a nerve can cause damage and pain. A common example is feeling pain after losing an arm or leg (phantom limb pain).  Compression-related. If a sensory nerve gets trapped or compressed for a long period of time, the blood supply to the nerve can be cut off.  Vascular. Many blood vessel diseases can cause neuropathic pain by decreasing blood supply and oxygen to  nerves.  Autoimmune. This type of pain results from diseases in which the body's defense system mistakenly attacks sensory nerves. Examples of autoimmune diseases that can cause neuropathic pain include lupus and multiple sclerosis.  Infectious. Many types of viral infections can damage sensory nerves and cause pain. Shingles infection is a common cause of this type of pain.  Inherited. Neuropathic pain can be a symptom of many diseases that are passed down through families (genetic). What are the signs or symptoms? The main symptom is pain. Neuropathic pain is often described as:  Burning.  Shock-like.  Stinging.  Hot or cold.  Itching. How is this diagnosed? No single test can diagnose neuropathic pain. Your health care provider will do a physical exam and ask you about your pain. You may use a pain scale to describe how bad your pain is. You may also have tests to see if you have a high sensitivity to pain and to help find the cause and location of any sensory nerve damage. These tests may include:  Imaging studies, such as:  X-rays.  CT scan.  MRI.  Nerve conduction studies to test how well nerve signals travel through your sensory nerves (electrodiagnostic testing).  Stimulating your sensory nerves through electrodes on your skin and measuring the response in your spinal cord and brain (somatosensory evoked potentials). How is this treated? Treatment for neuropathic pain may change over time. You may need to try different treatment options or a combination of treatments. Some options include:  Over-the-counter pain relievers.  Prescription medicines. Some medicines used to treat   other conditions may also help neuropathic pain. These include medicines to:  Control seizures (anticonvulsants).  Relieve depression (antidepressants).  Prescription-strength pain relievers (narcotics). These are usually used when other pain relievers do not help.  Transcutaneous nerve  stimulation (TENS). This uses electrical currents to block painful nerve signals. The treatment is painless.  Topical and local anesthetics. These are medicines that numb the nerves. They can be injected as a nerve block or applied to the skin.  Alternative treatments, such as:  Acupuncture.  Meditation.  Massage.  Physical therapy.  Pain management programs.  Counseling. Follow these instructions at home:  Learn as much as you can about your condition.  Take medicines only as directed by your health care provider.  Work closely with all your health care providers to find what works best for you.  Have a good support system at home.  Consider joining a chronic pain support group. Contact a health care provider if:  Your pain treatments are not helping.  You are having side effects from your medicines.  You are struggling with fatigue, mood changes, depression, or anxiety. This information is not intended to replace advice given to you by your health care provider. Make sure you discuss any questions you have with your health care provider. Document Released: 04/12/2004 Document Revised: 02/03/2016 Document Reviewed: 12/24/2013  2017 [employer]  

## 2016-08-26 NOTE — Progress Notes (Signed)
BP 134/89   Pulse 89   Temp 97.8 F (36.6 C) (Oral)   Ht 5\' 8"  (1.727 m)   Wt 185 lb 9.6 oz (84.2 kg)   BMI 28.22 kg/m    Subjective:    Patient ID: Shawn Malone, male    DOB: Dec 08, 1955, 61 y.o.   MRN: [id number]  HPI: Shawn Malone is a 61 y.o. male presenting on 08/23/2016 for Foot Pain (bilateral ) and medication follow up  This patient comes in for periodic recheck on medications and conditions. All medications are reviewed today. There are no reports of any problems with the medications. All of the medical conditions are reviewed and updated.  Lab work is reviewed and will be ordered as medically necessary. There are no new problems reported with today's visit. 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 He had difficulty taking metformin due to feeling quite weak and dizzy. He also had edema from it. He continue with foot pain. We have discussed with medications he has tried for this. Is able to have [facility] at this time.   Past Medical History:  Diagnosis Date  . Diabetes mellitus without complication (HCC)   . Elevated bilirubin   . Extremity edema    Relevant past medical, surgical, family and social history reviewed and updated as indicated. Interim medical history since our last visit reviewed. Allergies and medications reviewed and updated. DATA REVIEWED: CHART IN EPIC  Social History   Social History  . Marital status: Single    Spouse name: N/A  . Number of children: N/A  . Years of education: N/A   Occupational History  . Not on file.   Social History Main Topics  . Smoking status: Former Smoker    Quit date: 05/22/1983  . Smokeless tobacco: Never Used  . Alcohol use No     Comment: Currently no ETOH 08/10/16; Previously drank significantly  . Drug use: No  . Sexual activity: Yes    Partners: Female   Other Topics Concern  . Not on file   Social History Narrative  . No narrative  on file    Past Surgical History:  Procedure Laterality Date  . None     As of 08/10/16    Family History  Problem Relation Age of Onset  . Cancer Mother   . Colon cancer Brother 48  . Lung cancer Brother   . Liver disease Neg Hx     Review of Systems  Constitutional: Negative for appetite change and fatigue.  HENT: Negative.   Eyes: Negative.  Negative for pain and visual disturbance.  Respiratory: Negative.  Negative for cough, chest tightness, shortness of breath and wheezing.   Cardiovascular: Negative.  Negative for chest pain, palpitations and leg swelling.  Gastrointestinal: Negative.  Negative for abdominal pain, diarrhea, nausea and vomiting.  Endocrine: Negative.   Genitourinary: Negative.   Musculoskeletal: Negative.   Skin: Negative.  Negative for color change and rash.  Neurological: Positive for weakness. Negative for numbness and headaches.  Psychiatric/Behavioral: Negative.     Allergies as of 08/23/2016   No Known Allergies     Medication List       Accurate as of 08/23/16 11:59 PM. Always use your most recent med list.          amitriptyline 50 MG tablet Commonly known as:  ELAVIL Take 1 tablet (50 mg total) by mouth at bedtime.   aspirin 325 MG EC tablet  Take 325 mg by mouth daily.   furosemide 20 MG tablet Commonly known as:  LASIX Take 1 tablet (20 mg total) by mouth daily. Take 5-7 days for fluid   glyBURIDE 2.5 MG tablet Commonly known as:  DIABETA Take 1 tablet (2.5 mg total) by mouth daily with breakfast.   metoprolol succinate 50 MG 24 hr tablet Commonly known as:  TOPROL-XL Take 1 tablet (50 mg total) by mouth daily. Take with or immediately following a meal.   spironolactone 25 MG tablet Commonly known as:  ALDACTONE Take 1 tablet (25 mg total) by mouth daily. For fluid   traZODone 150 MG tablet Commonly known as:  DESYREL Use from 1/3 to 1 tablet nightly as needed for sleep.          Objective:    BP 134/89    Pulse 89   Temp 97.8 F (36.6 C) (Oral)   Ht 5\' 8"  (1.727 m)   Wt 185 lb 9.6 oz (84.2 kg)   BMI 28.22 kg/m   No Known Allergies  Wt Readings from Last 3 Encounters:  08/23/16 185 lb 9.6 oz (84.2 kg)  08/10/16 182 lb 12.8 oz (82.9 kg)  07/24/16 200 lb 6.4 oz (90.9 kg)    Physical Exam  Constitutional: He appears well-developed and well-nourished. No distress.  HENT:  Head: Normocephalic and atraumatic.  Eyes: Conjunctivae and EOM are normal. Pupils are equal, round, and reactive to light.  Cardiovascular: Normal rate, regular rhythm and normal heart sounds.   Pulmonary/Chest: Effort normal and breath sounds normal. No respiratory distress.  Skin: Skin is warm and dry.  Psychiatric: He has a normal mood and affect. His behavior is normal.  Nursing note and vitals reviewed.       Assessment & Plan:   1. Neuropathy (HCC) - amitriptyline (ELAVIL) 50 MG tablet; Take 1 tablet (50 mg total) by mouth at bedtime.  Dispense: 30 tablet; Refill: 2  2. Gilbert disease  3. Primary insomnia  4. Type 2 diabetes mellitus without complication, without long-term current use of insulin (HCC) - Hemoglobin A1c; Future   Continue all other maintenance medications as listed above.  Follow up plan: Return in about 3 months (around 11/21/2016) for recheck.  Orders Placed This Encounter  Procedures  . Hemoglobin A1c    Educational handout given for neuropathic pain  Remus Loffler PA-C [facility] [address]  [place], [place] [address] [phone]   08/26/2016, 9:08 PM

## 2016-08-29 ENCOUNTER — Other Ambulatory Visit (HOSPITAL_COMMUNITY)
Admission: RE | Admit: 2016-08-29 | Discharge: 2016-08-29 | Disposition: A | Discharge status: Home / Self Care | Payer: Self-pay | Source: Ambulatory Visit | Attending: Physician Assistant | Admitting: Physician Assistant

## 2016-08-29 DIAGNOSIS — E119 Type 2 diabetes mellitus without complications: Secondary | ICD-10-CM | POA: Insufficient documentation

## 2016-08-30 LAB — HEMOGLOBIN A1C
HEMOGLOBIN A1C: 7.2 % — AB (ref 4.8–5.6)
MEAN PLASMA GLUCOSE: 160 mg/dL

## 2016-09-08 ENCOUNTER — Other Ambulatory Visit: Payer: Self-pay | Admitting: Family Medicine

## 2016-09-08 ENCOUNTER — Other Ambulatory Visit: Payer: Self-pay | Admitting: Physician Assistant

## 2016-09-08 MED ORDER — SPIRONOLACTONE 25 MG PO TABS: 25.0000 mg | ORAL_TABLET | Freq: Every day | ORAL | 2 refills | Status: DC

## 2016-09-08 NOTE — Telephone Encounter (Signed)
Rx sent to pharmacy   

## 2016-09-14 ENCOUNTER — Ambulatory Visit: Payer: Self-pay | Admitting: Internal Medicine

## 2016-09-19 NOTE — Telephone Encounter (Signed)
What is the name of the medication? amitriptyline (ELAVIL) 50 MG tabletamitriptyline (ELAVIL) 50 MG tablet  Have you contacted your pharmacy to request a refill? yes  Which [facility] you [facility]   Patient notified that their request is being sent to the clinical staff for review and that they should receive a call once it is complete. If they do not receive a call within 24 hours they can check with their pharmacy or our office.   Pt wants 90 day supply

## 2016-09-20 ENCOUNTER — Telehealth: Payer: Self-pay | Admitting: Physician Assistant

## 2016-09-20 DIAGNOSIS — G629 Polyneuropathy, unspecified: Secondary | ICD-10-CM

## 2016-09-20 MED ORDER — AMITRIPTYLINE HCL 50 MG PO TABS: 50.0000 mg | ORAL_TABLET | Freq: Every day | ORAL | 1 refills | Status: DC

## 2016-09-20 NOTE — Telephone Encounter (Signed)
Informed [facility]

## 2016-10-17 ENCOUNTER — Other Ambulatory Visit: Payer: Self-pay

## 2016-10-17 DIAGNOSIS — G629 Polyneuropathy, unspecified: Secondary | ICD-10-CM

## 2016-10-17 MED ORDER — AMITRIPTYLINE HCL 50 MG PO TABS: 50.0000 mg | ORAL_TABLET | Freq: Every day | ORAL | 0 refills | Status: DC

## 2016-10-17 MED ORDER — FUROSEMIDE 20 MG PO TABS: 20.0000 mg | ORAL_TABLET | Freq: Every day | ORAL | 0 refills | Status: DC

## 2016-12-03 ENCOUNTER — Other Ambulatory Visit: Payer: Self-pay | Admitting: Physician Assistant

## 2016-12-03 ENCOUNTER — Other Ambulatory Visit: Payer: Self-pay | Admitting: Family Medicine

## 2016-12-03 DIAGNOSIS — IMO0002 Reserved for concepts with insufficient information to code with codable children: Secondary | ICD-10-CM

## 2016-12-03 DIAGNOSIS — E1121 Type 2 diabetes mellitus with diabetic nephropathy: Secondary | ICD-10-CM

## 2016-12-03 DIAGNOSIS — E1165 Type 2 diabetes mellitus with hyperglycemia: Principal | ICD-10-CM

## 2016-12-26 ENCOUNTER — Other Ambulatory Visit: Payer: Self-pay | Admitting: Physician Assistant

## 2016-12-27 ENCOUNTER — Other Ambulatory Visit: Payer: Self-pay | Admitting: Physician Assistant

## 2017-02-14 ENCOUNTER — Other Ambulatory Visit: Payer: Self-pay | Admitting: Physician Assistant

## 2017-02-14 MED ORDER — SPIRONOLACTONE 25 MG PO TABS: 25.0000 mg | ORAL_TABLET | Freq: Every day | ORAL | 3 refills | Status: DC

## 2017-03-11 ENCOUNTER — Other Ambulatory Visit: Payer: Self-pay | Admitting: Physician Assistant

## 2017-03-11 DIAGNOSIS — G629 Polyneuropathy, unspecified: Secondary | ICD-10-CM

## 2017-03-12 NOTE — Telephone Encounter (Signed)
Last [facility] 08/23/16  [facility]

## 2017-03-13 ENCOUNTER — Other Ambulatory Visit: Payer: Self-pay | Admitting: Physician Assistant

## 2017-03-13 DIAGNOSIS — IMO0002 Reserved for concepts with insufficient information to code with codable children: Secondary | ICD-10-CM

## 2017-03-13 DIAGNOSIS — E1165 Type 2 diabetes mellitus with hyperglycemia: Principal | ICD-10-CM

## 2017-03-13 DIAGNOSIS — E1121 Type 2 diabetes mellitus with diabetic nephropathy: Secondary | ICD-10-CM

## 2017-03-14 ENCOUNTER — Other Ambulatory Visit: Payer: Self-pay | Admitting: Physician Assistant

## 2017-03-14 DIAGNOSIS — E1165 Type 2 diabetes mellitus with hyperglycemia: Principal | ICD-10-CM

## 2017-03-14 DIAGNOSIS — IMO0002 Reserved for concepts with insufficient information to code with codable children: Secondary | ICD-10-CM

## 2017-03-14 DIAGNOSIS — E1121 Type 2 diabetes mellitus with diabetic nephropathy: Secondary | ICD-10-CM

## 2017-03-20 NOTE — Telephone Encounter (Signed)
Pt wanting 90 day supply on glyburide to get all medications at the same time Pt did get this filled last week for a 30 day supply This was d/t the fact that his last A1C was in January Will have Debra call back to make followup appt

## 2017-05-03 ENCOUNTER — Other Ambulatory Visit: Payer: Self-pay

## 2017-05-03 DIAGNOSIS — IMO0002 Reserved for concepts with insufficient information to code with codable children: Secondary | ICD-10-CM

## 2017-05-03 DIAGNOSIS — E1121 Type 2 diabetes mellitus with diabetic nephropathy: Secondary | ICD-10-CM

## 2017-05-03 DIAGNOSIS — E1165 Type 2 diabetes mellitus with hyperglycemia: Principal | ICD-10-CM

## 2017-05-03 MED ORDER — GLYBURIDE 2.5 MG PO TABS: 2.5000 mg | ORAL_TABLET | Freq: Every day | ORAL | 0 refills | Status: DC

## 2017-05-13 ENCOUNTER — Other Ambulatory Visit: Payer: Self-pay | Admitting: Family Medicine

## 2017-05-13 DIAGNOSIS — R000 Tachycardia, unspecified: Secondary | ICD-10-CM

## 2017-05-13 DIAGNOSIS — I10 Essential (primary) hypertension: Secondary | ICD-10-CM

## 2017-05-31 ENCOUNTER — Other Ambulatory Visit: Payer: Self-pay | Admitting: Physician Assistant

## 2017-05-31 DIAGNOSIS — G629 Polyneuropathy, unspecified: Secondary | ICD-10-CM

## 2017-05-31 NOTE — Telephone Encounter (Signed)
Last seen 08/23/16  Angel 

## 2017-07-08 ENCOUNTER — Other Ambulatory Visit: Payer: Self-pay | Admitting: Physician Assistant

## 2017-07-08 DIAGNOSIS — E1165 Type 2 diabetes mellitus with hyperglycemia: Principal | ICD-10-CM

## 2017-07-08 DIAGNOSIS — E1121 Type 2 diabetes mellitus with diabetic nephropathy: Secondary | ICD-10-CM

## 2017-07-08 DIAGNOSIS — IMO0002 Reserved for concepts with insufficient information to code with codable children: Secondary | ICD-10-CM

## 2017-10-04 ENCOUNTER — Other Ambulatory Visit: Payer: Self-pay | Admitting: Physician Assistant

## 2017-10-04 DIAGNOSIS — E1165 Type 2 diabetes mellitus with hyperglycemia: Principal | ICD-10-CM

## 2017-10-04 DIAGNOSIS — E1121 Type 2 diabetes mellitus with diabetic nephropathy: Secondary | ICD-10-CM

## 2017-10-04 DIAGNOSIS — IMO0002 Reserved for concepts with insufficient information to code with codable children: Secondary | ICD-10-CM

## 2017-10-18 ENCOUNTER — Encounter: Payer: Self-pay | Admitting: Physician Assistant

## 2017-10-18 ENCOUNTER — Ambulatory Visit (INDEPENDENT_AMBULATORY_CARE_PROVIDER_SITE_OTHER): Payer: Self-pay | Admitting: Physician Assistant

## 2017-10-18 VITALS — BP 127/85 | HR 98 | Temp 97.9°F | Ht 68.0 in | Wt 201.4 lb

## 2017-10-18 DIAGNOSIS — R000 Tachycardia, unspecified: Secondary | ICD-10-CM

## 2017-10-18 DIAGNOSIS — E119 Type 2 diabetes mellitus without complications: Secondary | ICD-10-CM

## 2017-10-18 DIAGNOSIS — G629 Polyneuropathy, unspecified: Secondary | ICD-10-CM

## 2017-10-18 DIAGNOSIS — I10 Essential (primary) hypertension: Secondary | ICD-10-CM

## 2017-10-18 LAB — BAYER DCA HB A1C WAIVED: HB A1C: 10.9 % — AB (ref <7.0)

## 2017-10-18 MED ORDER — GABAPENTIN 100 MG PO CAPS
100.0000 mg | ORAL_CAPSULE | Freq: Three times a day (TID) | ORAL | 3 refills | Status: DC
Start: 2017-10-18 — End: 2017-12-02

## 2017-10-18 MED ORDER — METOPROLOL SUCCINATE ER 50 MG PO TB24: 50.0000 mg | ORAL_TABLET | Freq: Every day | ORAL | 3 refills | Status: DC

## 2017-10-18 MED ORDER — GLYBURIDE 5 MG PO TABS: 5.0000 mg | ORAL_TABLET | Freq: Every day | ORAL | 0 refills | Status: DC

## 2017-10-18 MED ORDER — SPIRONOLACTONE 25 MG PO TABS: 25.0000 mg | ORAL_TABLET | Freq: Every day | ORAL | 3 refills | Status: DC

## 2017-10-19 LAB — CMP14+EGFR
ALT: 48 IU/L — AB (ref 0–44)
AST: 37 IU/L (ref 0–40)
Albumin/Globulin Ratio: 1.9 (ref 1.2–2.2)
Albumin: 4.7 g/dL (ref 3.6–4.8)
Alkaline Phosphatase: 114 IU/L (ref 39–117)
BUN/Creatinine Ratio: 10 (ref 10–24)
BUN: 9 mg/dL (ref 8–27)
Bilirubin Total: 1.2 mg/dL (ref 0.0–1.2)
CALCIUM: 9.5 mg/dL (ref 8.6–10.2)
CO2: 23 mmol/L (ref 20–29)
CREATININE: 0.87 mg/dL (ref 0.76–1.27)
Chloride: 96 mmol/L (ref 96–106)
GFR calc Af Amer: 108 mL/min/{1.73_m2} (ref >59)
GFR calc non Af Amer: 94 mL/min/{1.73_m2} (ref >59)
GLOBULIN, TOTAL: 2.5 g/dL (ref 1.5–4.5)
Glucose: 250 mg/dL — ABNORMAL HIGH (ref 65–99)
POTASSIUM: 4.7 mmol/L (ref 3.5–5.2)
SODIUM: 135 mmol/L (ref 134–144)
Total Protein: 7.2 g/dL (ref 6.0–8.5)

## 2017-10-19 LAB — MICROALBUMIN / CREATININE URINE RATIO
Creatinine, Urine: 38.1 mg/dL
MICROALB/CREAT RATIO: 8.1 mg/g{creat} (ref 0.0–30.0)
MICROALBUM., U, RANDOM: 3.1 ug/mL

## 2017-10-21 ENCOUNTER — Other Ambulatory Visit: Payer: Self-pay | Admitting: Physician Assistant

## 2017-10-21 MED ORDER — PIOGLITAZONE HCL 30 MG PO TABS: 30.0000 mg | ORAL_TABLET | Freq: Every day | ORAL | 2 refills | Status: DC

## 2017-10-21 NOTE — Progress Notes (Signed)
BP 127/85   Pulse 98   Temp 97.9 F (36.6 C) (Oral)   Ht 5' 8" (1.727 m)   Wt 201 lb 6.4 oz (91.4 kg)   BMI 30.62 kg/m    Subjective:    Patient ID: Shawn Malone, male    DOB: 1955/10/17, 62 y.o.   MRN: [id number]  HPI: Shawn Malone is a 62 y.o. male presenting on 10/18/2017 for Follow-up (Medication refill )  This patient comes in for periodic recheck on medications and conditions including type 2 diabetes, Kuebler disease, hypertension, tachycardia, neuropathy.  He is having labs performed today.  He has not been having any severe hyperglycemia.  He states the highest he had seen was in the middle 200s.  Most the time in the morning he is 150 or under.  He did not tolerate metformin..   All medications are reviewed today. There are no reports of any problems with the medications. All of the medical conditions are reviewed and updated.  Lab work is reviewed and will be ordered as medically necessary. There are no new problems reported with today's visit.   Past Medical History:  Diagnosis Date  . Diabetes mellitus without complication ([place])   . Elevated bilirubin   . Extremity edema    Relevant past medical, surgical, family and social history reviewed and updated as indicated. Interim medical history since our last visit reviewed. Allergies and medications reviewed and updated. DATA REVIEWED: CHART IN EPIC  Family History reviewed for pertinent findings.  Review of Systems  Constitutional: Negative.  Negative for appetite change and fatigue.  HENT: Negative.   Eyes: Negative.  Negative for pain and visual disturbance.  Respiratory: Negative.  Negative for cough, chest tightness, shortness of breath and wheezing.   Cardiovascular: Negative.  Negative for chest pain, palpitations and leg swelling.  Gastrointestinal: Negative.  Negative for abdominal pain, diarrhea, nausea and vomiting.  Endocrine: Negative.   Genitourinary: Negative.   Musculoskeletal: Positive for  arthralgias and back pain.  Skin: Negative.  Negative for color change and rash.  Neurological: Negative.  Negative for weakness, numbness and headaches.  Psychiatric/Behavioral: Negative.     Allergies as of 10/18/2017      Reactions   Metformin And Related    Stomach and feeling bad      Medication List        Accurate as of 10/18/17 11:59 PM. Always use your most recent med list.          aspirin 325 MG EC tablet Take 325 mg by mouth daily.   furosemide 20 MG tablet Commonly known as:  LASIX Take 1 tablet (20 mg total) by mouth daily. Take 5-7 days for fluid   gabapentin 100 MG capsule Commonly known as:  NEURONTIN Take 1-3 capsules (100-300 mg total) by mouth 3 (three) times daily.   glyBURIDE 5 MG tablet Commonly known as:  DIABETA Take 1 tablet (5 mg total) by mouth daily with breakfast.   metoprolol succinate 50 MG 24 hr tablet Commonly known as:  TOPROL-XL Take 1 tablet (50 mg total) by mouth daily. Take with or immediately following a meal.   spironolactone 25 MG tablet Commonly known as:  ALDACTONE Take 1 tablet (25 mg total) by mouth daily. For fluid          Objective:    BP 127/85   Pulse 98   Temp 97.9 F (36.6 C) (Oral)   Ht 5' 8" (1.727 m)   Wt 201 lb 6.4  oz (91.4 kg)   BMI 30.62 kg/m   Allergies  Allergen Reactions  . Metformin And Related     Stomach and feeling bad    Wt Readings from Last 3 Encounters:  10/18/17 201 lb 6.4 oz (91.4 kg)  08/23/16 185 lb 9.6 oz (84.2 kg)  08/10/16 182 lb 12.8 oz (82.9 kg)    Physical Exam  Constitutional: He appears well-developed and well-nourished. No distress.  HENT:  Head: Normocephalic and atraumatic.  Eyes: Pupils are equal, round, and reactive to light. Conjunctivae and EOM are normal.  Cardiovascular: Normal rate, regular rhythm and normal heart sounds.  Pulmonary/Chest: Effort normal and breath sounds normal. No respiratory distress.  Skin: Skin is warm and dry.  Psychiatric: He has  a normal mood and affect. His behavior is normal.  Nursing note and vitals reviewed.   Results for orders placed or performed in visit on 10/18/17  CMP14+EGFR  Result Value Ref Range   Glucose 250 (H) 65 - 99 mg/dL   BUN 9 8 - 27 mg/dL   Creatinine, Ser 0.87 0.76 - 1.27 mg/dL   GFR calc non Af Amer 94 >59 mL/min/1.73   GFR calc Af Amer 108 >59 mL/min/1.73   BUN/Creatinine Ratio 10 10 - 24   Sodium 135 134 - 144 mmol/L   Potassium 4.7 3.5 - 5.2 mmol/L   Chloride 96 96 - 106 mmol/L   CO2 23 20 - 29 mmol/L   Calcium 9.5 8.6 - 10.2 mg/dL   Total Protein 7.2 6.0 - 8.5 g/dL   Albumin 4.7 3.6 - 4.8 g/dL   Globulin, Total 2.5 1.5 - 4.5 g/dL   Albumin/Globulin Ratio 1.9 1.2 - 2.2   Bilirubin Total 1.2 0.0 - 1.2 mg/dL   Alkaline Phosphatase 114 39 - 117 IU/L   AST 37 0 - 40 IU/L   ALT 48 (H) 0 - 44 IU/L  Microalbumin / creatinine urine ratio  Result Value Ref Range   Creatinine, Urine 38.1 Not Estab. mg/dL   Microalbumin, Urine 3.1 Not Estab. ug/mL   Microalb/Creat Ratio 8.1 0.0 - 30.0 mg/g creat  Bayer DCA Hb A1c Waived  Result Value Ref Range   Bayer DCA Hb A1c Waived 10.9 (H) <7.0 %      Assessment & Plan:   1. Type 2 diabetes mellitus without complication, without long-term current use of insulin (HCC) - CMP14+EGFR - Microalbumin / creatinine urine ratio - Bayer DCA Hb A1c Waived - glyBURIDE (DIABETA) 5 MG tablet; Take 1 tablet (5 mg total) by mouth daily with breakfast.  Dispense: 90 tablet; Refill: 0  2. Gilbert disease  3. Accelerated hypertension - spironolactone (ALDACTONE) 25 MG tablet; Take 1 tablet (25 mg total) by mouth daily. For fluid  Dispense: 90 tablet; Refill: 3 - metoprolol succinate (TOPROL-XL) 50 MG 24 hr tablet; Take 1 tablet (50 mg total) by mouth daily. Take with or immediately following a meal.  Dispense: 90 tablet; Refill: 3  4. Tachycardia with hypertension - metoprolol succinate (TOPROL-XL) 50 MG 24 hr tablet; Take 1 tablet (50 mg total) by mouth  daily. Take with or immediately following a meal.  Dispense: 90 tablet; Refill: 3  5. Neuropathy - gabapentin (NEURONTIN) 100 MG capsule; Take 1-3 capsules (100-300 mg total) by mouth 3 (three) times daily.  Dispense: 90 capsule; Refill: 3   Continue all other maintenance medications as listed above.  Follow up plan: Return in about 3 months (around 01/18/2018) for rechec.  Educational handout given for survey  Terald Sleeper PA-C [place] [address]  [place], [place] [address] [phone]   10/21/2017, 9:13 AM

## 2017-11-28 ENCOUNTER — Other Ambulatory Visit: Payer: Self-pay | Admitting: Physician Assistant

## 2017-11-28 DIAGNOSIS — G629 Polyneuropathy, unspecified: Secondary | ICD-10-CM

## 2017-12-02 ENCOUNTER — Telehealth: Payer: Self-pay | Admitting: Physician Assistant

## 2017-12-02 ENCOUNTER — Other Ambulatory Visit: Payer: Self-pay | Admitting: Physician Assistant

## 2017-12-02 DIAGNOSIS — G629 Polyneuropathy, unspecified: Secondary | ICD-10-CM

## 2017-12-02 MED ORDER — GABAPENTIN 400 MG PO CAPS: 400.0000 mg | ORAL_CAPSULE | Freq: Three times a day (TID) | ORAL | 2 refills | Status: DC

## 2017-12-02 NOTE — Telephone Encounter (Signed)
Sent increased dose.

## 2017-12-03 ENCOUNTER — Other Ambulatory Visit: Payer: Self-pay | Admitting: Physician Assistant

## 2017-12-03 DIAGNOSIS — G629 Polyneuropathy, unspecified: Secondary | ICD-10-CM

## 2017-12-04 MED ORDER — GABAPENTIN 400 MG PO CAPS: 400.0000 mg | ORAL_CAPSULE | Freq: Three times a day (TID) | ORAL | 0 refills | Status: DC

## 2017-12-04 NOTE — Addendum Note (Signed)
Addended by: Julious Payer D on: 12/04/2017 09:06 AM   Modules accepted: Orders

## 2017-12-11 NOTE — Telephone Encounter (Signed)
Patient aware.

## 2018-01-08 ENCOUNTER — Other Ambulatory Visit: Payer: Self-pay | Admitting: Physician Assistant

## 2018-01-08 DIAGNOSIS — E119 Type 2 diabetes mellitus without complications: Secondary | ICD-10-CM

## 2018-02-13 ENCOUNTER — Other Ambulatory Visit: Payer: Self-pay | Admitting: Physician Assistant

## 2018-02-14 NOTE — Telephone Encounter (Signed)
Last seen 10/18/17  Angel 

## 2018-02-23 ENCOUNTER — Other Ambulatory Visit: Payer: Self-pay | Admitting: Physician Assistant

## 2018-02-23 DIAGNOSIS — G629 Polyneuropathy, unspecified: Secondary | ICD-10-CM

## 2018-02-24 NOTE — Telephone Encounter (Signed)
Last seen 3/19  Shawn Malone 

## 2018-03-10 ENCOUNTER — Other Ambulatory Visit: Payer: Self-pay | Admitting: Physician Assistant

## 2018-03-10 DIAGNOSIS — G629 Polyneuropathy, unspecified: Secondary | ICD-10-CM

## 2018-03-10 NOTE — Telephone Encounter (Signed)
Last seen 10/18/17  Shawn Malone 

## 2018-03-10 NOTE — Telephone Encounter (Signed)
Needs OV.  Refilled x1.

## 2018-03-11 NOTE — Telephone Encounter (Signed)
Attempted to contact - NA 

## 2018-04-06 ENCOUNTER — Other Ambulatory Visit: Payer: Self-pay | Admitting: Physician Assistant

## 2018-04-06 DIAGNOSIS — E119 Type 2 diabetes mellitus without complications: Secondary | ICD-10-CM

## 2018-04-07 NOTE — Telephone Encounter (Signed)
Last seen 10/18/17

## 2018-05-07 ENCOUNTER — Other Ambulatory Visit: Payer: Self-pay | Admitting: Physician Assistant

## 2018-05-07 NOTE — Telephone Encounter (Signed)
Last seen 3/19 

## 2018-05-12 ENCOUNTER — Telehealth: Payer: Self-pay | Admitting: Physician Assistant

## 2018-05-12 ENCOUNTER — Telehealth: Payer: Self-pay | Admitting: Family Medicine

## 2018-05-12 ENCOUNTER — Other Ambulatory Visit: Payer: Self-pay | Admitting: Physician Assistant

## 2018-05-12 DIAGNOSIS — G629 Polyneuropathy, unspecified: Secondary | ICD-10-CM

## 2018-05-12 MED ORDER — AMITRIPTYLINE HCL 50 MG PO TABS: 50.0000 mg | ORAL_TABLET | Freq: Every day | ORAL | 0 refills | Status: DC

## 2018-05-12 NOTE — Telephone Encounter (Signed)
Patient aware of rx sent to pharmacy and aware that they need to be seen.

## 2018-06-13 ENCOUNTER — Other Ambulatory Visit: Payer: Self-pay | Admitting: Family Medicine

## 2018-06-13 DIAGNOSIS — G629 Polyneuropathy, unspecified: Secondary | ICD-10-CM

## 2018-06-16 ENCOUNTER — Other Ambulatory Visit: Payer: Self-pay | Admitting: Family Medicine

## 2018-06-16 DIAGNOSIS — G629 Polyneuropathy, unspecified: Secondary | ICD-10-CM

## 2018-07-01 ENCOUNTER — Other Ambulatory Visit: Payer: Self-pay | Admitting: Physician Assistant

## 2018-07-01 DIAGNOSIS — E119 Type 2 diabetes mellitus without complications: Secondary | ICD-10-CM

## 2018-07-01 NOTE — Telephone Encounter (Signed)
Last seen 10/18/17

## 2018-07-08 ENCOUNTER — Other Ambulatory Visit: Payer: Self-pay | Admitting: Physician Assistant

## 2018-07-08 NOTE — Telephone Encounter (Signed)
Informed RF was done 05/12/18, given 30 days then & instructed NTBS. Appt made for 07/11/18

## 2018-07-11 ENCOUNTER — Ambulatory Visit (INDEPENDENT_AMBULATORY_CARE_PROVIDER_SITE_OTHER): Payer: Self-pay | Admitting: Physician Assistant

## 2018-07-11 ENCOUNTER — Encounter: Payer: Self-pay | Admitting: Physician Assistant

## 2018-07-11 ENCOUNTER — Telehealth: Payer: Self-pay | Admitting: Physician Assistant

## 2018-07-11 VITALS — BP 130/79 | HR 102 | Temp 97.8°F | Ht 68.0 in | Wt 213.2 lb

## 2018-07-11 DIAGNOSIS — R000 Tachycardia, unspecified: Secondary | ICD-10-CM

## 2018-07-11 DIAGNOSIS — E119 Type 2 diabetes mellitus without complications: Secondary | ICD-10-CM

## 2018-07-11 DIAGNOSIS — I10 Essential (primary) hypertension: Secondary | ICD-10-CM

## 2018-07-11 DIAGNOSIS — G629 Polyneuropathy, unspecified: Secondary | ICD-10-CM

## 2018-07-11 LAB — CMP14+EGFR
ALT: 40 IU/L (ref 0–44)
AST: 29 IU/L (ref 0–40)
Albumin/Globulin Ratio: 1.6 (ref 1.2–2.2)
Albumin: 4.3 g/dL (ref 3.6–4.8)
Alkaline Phosphatase: 85 IU/L (ref 39–117)
BUN/Creatinine Ratio: 10 (ref 10–24)
BUN: 11 mg/dL (ref 8–27)
Bilirubin Total: 1.2 mg/dL (ref 0.0–1.2)
CO2: 23 mmol/L (ref 20–29)
CREATININE: 1.08 mg/dL (ref 0.76–1.27)
Calcium: 9.4 mg/dL (ref 8.6–10.2)
Chloride: 91 mmol/L — ABNORMAL LOW (ref 96–106)
GFR calc Af Amer: 86 mL/min/{1.73_m2} (ref >59)
GFR calc non Af Amer: 74 mL/min/{1.73_m2} (ref >59)
GLUCOSE: 243 mg/dL — AB (ref 65–99)
Globulin, Total: 2.7 g/dL (ref 1.5–4.5)
Potassium: 4.6 mmol/L (ref 3.5–5.2)
Sodium: 129 mmol/L — ABNORMAL LOW (ref 134–144)
Total Protein: 7 g/dL (ref 6.0–8.5)

## 2018-07-11 LAB — BAYER DCA HB A1C WAIVED: HB A1C (BAYER DCA - WAIVED): 8.7 % — ABNORMAL HIGH (ref <7.0)

## 2018-07-11 MED ORDER — AMITRIPTYLINE HCL 100 MG PO TABS: 50.0000 mg | ORAL_TABLET | Freq: Every day | ORAL | 3 refills | Status: DC

## 2018-07-11 MED ORDER — GABAPENTIN 600 MG PO TABS: 600.0000 mg | ORAL_TABLET | Freq: Three times a day (TID) | ORAL | 3 refills | Status: DC

## 2018-07-11 MED ORDER — GLYBURIDE 5 MG PO TABS: ORAL_TABLET | ORAL | 3 refills | Status: DC

## 2018-07-11 MED ORDER — SPIRONOLACTONE 25 MG PO TABS: 25.0000 mg | ORAL_TABLET | Freq: Every day | ORAL | 3 refills | Status: DC

## 2018-07-11 MED ORDER — PIOGLITAZONE HCL 30 MG PO TABS: 30.0000 mg | ORAL_TABLET | Freq: Every day | ORAL | 3 refills | Status: DC

## 2018-07-11 MED ORDER — METOPROLOL SUCCINATE ER 50 MG PO TB24: 50.0000 mg | ORAL_TABLET | Freq: Every day | ORAL | 3 refills | Status: DC

## 2018-07-11 NOTE — Telephone Encounter (Signed)
Patient aware of a1c

## 2018-07-14 ENCOUNTER — Telehealth: Payer: Self-pay | Admitting: Physician Assistant

## 2018-07-14 DIAGNOSIS — G629 Polyneuropathy, unspecified: Secondary | ICD-10-CM

## 2018-07-14 MED ORDER — AMITRIPTYLINE HCL 100 MG PO TABS: 100.0000 mg | ORAL_TABLET | Freq: Every day | ORAL | 3 refills | Status: DC

## 2018-07-14 NOTE — Telephone Encounter (Signed)
Shawn Malone is calling to get clarification on how to take the amitriptyline (ELAVIL) 100 MG tablet thought that the pt was suppose to increase the amount. Please leave message on debras VM she is going into work.

## 2018-07-14 NOTE — Telephone Encounter (Signed)
We increased it to 100 mg, take at bedtime.

## 2018-07-14 NOTE — Telephone Encounter (Signed)
Is pt supposed to be taking 50 mg or 100mg  Amitriptyline?

## 2018-07-14 NOTE — Progress Notes (Signed)
BP 130/79   Pulse (!) 102   Temp 97.8 F (36.6 C) (Oral)   Ht '5\' 8"'$  (1.727 m)   Wt 213 lb 3.2 oz (96.7 kg)   BMI 32.42 kg/m    Subjective:    Patient ID: Shawn Malone, male    DOB: 03-14-1956, 63 y.o.   MRN: [id number]  HPI: Shawn Malone is a 62 y.o. male presenting on 07/11/2018 for Diabetes (follow up)  This patient comes in for periodic recheck on his chronic medical conditions.  They do include diabetes, chronic neuropathy, history of elevated liver enzymes secondary to Guilbert disease, hypertension.  All of his medications are reviewed today.  He does need refills.  He states that the amitriptyline was helping at bedtime but he does think he could use a little bit more.  We have discussed increasing medication refill will be sent.  He does need refills on her diabetic medications.  We will also draw labs today.  There are no other complaints at this time.  Past Medical History:  Diagnosis Date  . Diabetes mellitus without complication ([place])   . Elevated bilirubin   . Extremity edema    Relevant past medical, surgical, family and social history reviewed and updated as indicated. Interim medical history since our last visit reviewed. Allergies and medications reviewed and updated. DATA REVIEWED: CHART IN EPIC  Family History reviewed for pertinent findings.  Review of Systems  Constitutional: Negative.  Negative for appetite change and fatigue.  Eyes: Negative for pain and visual disturbance.  Respiratory: Negative.  Negative for cough, chest tightness, shortness of breath and wheezing.   Cardiovascular: Negative.  Negative for chest pain, palpitations and leg swelling.  Gastrointestinal: Negative.  Negative for abdominal pain, diarrhea, nausea and vomiting.  Genitourinary: Negative.   Skin: Negative.  Negative for color change and rash.  Neurological: Negative.  Negative for weakness, numbness and headaches.  Psychiatric/Behavioral: Negative.     Allergies as of  07/11/2018      Reactions   Metformin And Related    Stomach and feeling bad      Medication List       Accurate as of July 11, 2018 11:59 PM. Always use your most recent med list.        amitriptyline 100 MG tablet Commonly known as:  ELAVIL Take 0.5 tablets (50 mg total) by mouth at bedtime.   aspirin 325 MG EC tablet Take 325 mg by mouth daily.   gabapentin 600 MG tablet Commonly known as:  NEURONTIN Take 1 tablet (600 mg total) by mouth 3 (three) times daily.   glyBURIDE 5 MG tablet Commonly known as:  DIABETA TAKE 1 TABLET BY MOUTH EVERY DAY WITH BREAKFAST   metoprolol succinate 50 MG 24 hr tablet Commonly known as:  TOPROL-XL Take 1 tablet (50 mg total) by mouth daily. Take with or immediately following a meal.   pioglitazone 30 MG tablet Commonly known as:  ACTOS Take 1 tablet (30 mg total) by mouth daily.   spironolactone 25 MG tablet Commonly known as:  ALDACTONE Take 1 tablet (25 mg total) by mouth daily. For fluid          Objective:    BP 130/79   Pulse (!) 102   Temp 97.8 F (36.6 C) (Oral)   Ht '5\' 8"'$  (1.727 m)   Wt 213 lb 3.2 oz (96.7 kg)   BMI 32.42 kg/m   Allergies  Allergen Reactions  . Metformin And Related  Stomach and feeling bad    Wt Readings from Last 3 Encounters:  07/11/18 213 lb 3.2 oz (96.7 kg)  10/18/17 201 lb 6.4 oz (91.4 kg)  08/23/16 185 lb 9.6 oz (84.2 kg)    Physical Exam Constitutional:      Appearance: He is well-developed.  HENT:     Head: Normocephalic and atraumatic.  Eyes:     Conjunctiva/sclera: Conjunctivae normal.     Pupils: Pupils are equal, round, and reactive to light.  Neck:     Musculoskeletal: Normal range of motion and neck supple.  Cardiovascular:     Rate and Rhythm: Normal rate and regular rhythm.     Heart sounds: Normal heart sounds.  Pulmonary:     Effort: Pulmonary effort is normal.     Breath sounds: Normal breath sounds.  Abdominal:     General: Bowel sounds are normal.       Palpations: Abdomen is soft.  Musculoskeletal: Normal range of motion.  Skin:    General: Skin is warm and dry.     Results for orders placed or performed in visit on 07/11/18  Bayer DCA Hb A1c Waived  Result Value Ref Range   HB A1C (BAYER DCA - WAIVED) 8.7 (H) <7.0 %  CMP14+EGFR  Result Value Ref Range   Glucose 243 (H) 65 - 99 mg/dL   BUN 11 8 - 27 mg/dL   Creatinine, Ser 1.08 0.76 - 1.27 mg/dL   GFR calc non Af Amer 74 >59 mL/min/1.73   GFR calc Af Amer 86 >59 mL/min/1.73   BUN/Creatinine Ratio 10 10 - 24   Sodium 129 (L) 134 - 144 mmol/L   Potassium 4.6 3.5 - 5.2 mmol/L   Chloride 91 (L) 96 - 106 mmol/L   CO2 23 20 - 29 mmol/L   Calcium 9.4 8.6 - 10.2 mg/dL   Total Protein 7.0 6.0 - 8.5 g/dL   Albumin 4.3 3.6 - 4.8 g/dL   Globulin, Total 2.7 1.5 - 4.5 g/dL   Albumin/Globulin Ratio 1.6 1.2 - 2.2   Bilirubin Total 1.2 0.0 - 1.2 mg/dL   Alkaline Phosphatase 85 39 - 117 IU/L   AST 29 0 - 40 IU/L   ALT 40 0 - 44 IU/L      Assessment & Plan:   1. Type 2 diabetes mellitus without complication, without long-term current use of insulin (HCC) - Bayer DCA Hb A1c Waived - CMP14+EGFR - glyBURIDE (DIABETA) 5 MG tablet; TAKE 1 TABLET BY MOUTH EVERY DAY WITH BREAKFAST  Dispense: 90 tablet; Refill: 3  2. Rosanna Randy disease - CMP14+EGFR  3. Accelerated hypertension - spironolactone (ALDACTONE) 25 MG tablet; Take 1 tablet (25 mg total) by mouth daily. For fluid  Dispense: 90 tablet; Refill: 3 - metoprolol succinate (TOPROL-XL) 50 MG 24 hr tablet; Take 1 tablet (50 mg total) by mouth daily. Take with or immediately following a meal.  Dispense: 90 tablet; Refill: 3  4. Tachycardia with hypertension - metoprolol succinate (TOPROL-XL) 50 MG 24 hr tablet; Take 1 tablet (50 mg total) by mouth daily. Take with or immediately following a meal.  Dispense: 90 tablet; Refill: 3  5. Neuropathy Continue amitriptyline   Continue all other maintenance medications as listed  above.  Follow up plan: Return in about 6 months (around 01/10/2019).  Educational handout given for [place] PA-C [facility] [address]  [place], [place] [address] [phone]   07/14/2018, 1:59 PM

## 2018-07-14 NOTE — Telephone Encounter (Signed)
Left detailed message stating pt should be on 100mg  at bedtime and new rx sent over to pharmacy and to call back with any further questions or concerns.

## 2018-10-25 ENCOUNTER — Other Ambulatory Visit: Payer: Self-pay | Admitting: Physician Assistant

## 2018-10-25 DIAGNOSIS — E119 Type 2 diabetes mellitus without complications: Secondary | ICD-10-CM

## 2018-11-12 ENCOUNTER — Telehealth: Payer: Self-pay | Admitting: Physician Assistant

## 2018-11-12 ENCOUNTER — Other Ambulatory Visit: Payer: Self-pay | Admitting: Physician Assistant

## 2018-11-12 ENCOUNTER — Other Ambulatory Visit: Payer: Self-pay | Admitting: *Deleted

## 2018-11-12 MED ORDER — GABAPENTIN 600 MG PO TABS: 600.0000 mg | ORAL_TABLET | Freq: Three times a day (TID) | ORAL | 0 refills | Status: DC

## 2018-11-12 MED ORDER — GABAPENTIN 300 MG PO CAPS: 300.0000 mg | ORAL_CAPSULE | Freq: Three times a day (TID) | ORAL | 5 refills | Status: DC

## 2018-11-13 NOTE — Telephone Encounter (Signed)
lmtcb

## 2018-11-13 NOTE — Telephone Encounter (Signed)
Aware sent to pharmacy 

## 2018-11-13 NOTE — Telephone Encounter (Signed)
sent 

## 2018-11-26 ENCOUNTER — Telehealth: Payer: Self-pay | Admitting: Physician Assistant

## 2018-12-04 ENCOUNTER — Other Ambulatory Visit: Payer: Self-pay | Admitting: Physician Assistant

## 2019-07-20 ENCOUNTER — Telehealth: Payer: Self-pay | Admitting: Physician Assistant

## 2019-07-20 ENCOUNTER — Other Ambulatory Visit: Payer: Self-pay | Admitting: Physician Assistant

## 2019-07-20 DIAGNOSIS — I10 Essential (primary) hypertension: Secondary | ICD-10-CM

## 2019-07-20 DIAGNOSIS — G629 Polyneuropathy, unspecified: Secondary | ICD-10-CM

## 2019-07-20 DIAGNOSIS — E119 Type 2 diabetes mellitus without complications: Secondary | ICD-10-CM

## 2019-07-20 DIAGNOSIS — R000 Tachycardia, unspecified: Secondary | ICD-10-CM

## 2019-07-20 MED ORDER — GABAPENTIN 300 MG PO CAPS: 300.0000 mg | ORAL_CAPSULE | Freq: Three times a day (TID) | ORAL | 0 refills | Status: DC

## 2019-07-20 MED ORDER — SPIRONOLACTONE 25 MG PO TABS: 25.0000 mg | ORAL_TABLET | Freq: Every day | ORAL | 0 refills | Status: DC

## 2019-07-20 MED ORDER — PIOGLITAZONE HCL 30 MG PO TABS: 30.0000 mg | ORAL_TABLET | Freq: Every day | ORAL | 0 refills | Status: DC

## 2019-07-20 MED ORDER — AMITRIPTYLINE HCL 100 MG PO TABS: 100.0000 mg | ORAL_TABLET | Freq: Every day | ORAL | 0 refills | Status: DC

## 2019-07-20 MED ORDER — METOPROLOL SUCCINATE ER 50 MG PO TB24: 50.0000 mg | ORAL_TABLET | Freq: Every day | ORAL | 0 refills | Status: DC

## 2019-07-20 MED ORDER — GLYBURIDE 5 MG PO TABS: 5.0000 mg | ORAL_TABLET | Freq: Every day | ORAL | 0 refills | Status: DC

## 2019-07-20 NOTE — Telephone Encounter (Signed)
Last office visit 07/11/2018, patient is requesting a refill of all medications.  I scheduled an appointment for him on 08/24/2019.  He wants to know if you will send in refills to:  [facility]:  Aldactone, Metoprolol  [facility]:  Gabapentin, Amitriptyline, Actos, Glyburide

## 2019-07-20 NOTE — Telephone Encounter (Signed)
I have sent the prescriptions in for 30-day supplies.  He must have an appointment before I can refill it anymore and labs need to be performed.  He needs to be seen on an every 93-month basis for these conditions now.

## 2019-07-21 ENCOUNTER — Other Ambulatory Visit: Payer: Self-pay | Admitting: Physician Assistant

## 2019-07-21 DIAGNOSIS — E119 Type 2 diabetes mellitus without complications: Secondary | ICD-10-CM

## 2019-07-21 NOTE — Telephone Encounter (Signed)
Left message to call back  

## 2019-07-29 NOTE — Telephone Encounter (Signed)
Patient has a follow up appointment scheduled for January 25,2021.

## 2019-08-12 ENCOUNTER — Other Ambulatory Visit: Payer: Self-pay | Admitting: Physician Assistant

## 2019-08-12 DIAGNOSIS — I10 Essential (primary) hypertension: Secondary | ICD-10-CM

## 2019-08-12 DIAGNOSIS — R000 Tachycardia, unspecified: Secondary | ICD-10-CM

## 2019-08-21 ENCOUNTER — Telehealth: Payer: Self-pay | Admitting: Physician Assistant

## 2019-08-24 ENCOUNTER — Encounter: Payer: Self-pay | Admitting: Physician Assistant

## 2019-08-24 ENCOUNTER — Ambulatory Visit (INDEPENDENT_AMBULATORY_CARE_PROVIDER_SITE_OTHER): Payer: Self-pay | Admitting: Physician Assistant

## 2019-08-24 ENCOUNTER — Other Ambulatory Visit: Payer: Self-pay

## 2019-08-24 VITALS — BP 135/81 | HR 107 | Temp 99.3°F | Ht 68.0 in | Wt 207.8 lb

## 2019-08-24 DIAGNOSIS — E1365 Other specified diabetes mellitus with hyperglycemia: Secondary | ICD-10-CM

## 2019-08-24 DIAGNOSIS — G629 Polyneuropathy, unspecified: Secondary | ICD-10-CM

## 2019-08-24 DIAGNOSIS — IMO0002 Reserved for concepts with insufficient information to code with codable children: Secondary | ICD-10-CM

## 2019-08-24 DIAGNOSIS — E119 Type 2 diabetes mellitus without complications: Secondary | ICD-10-CM

## 2019-08-24 DIAGNOSIS — R000 Tachycardia, unspecified: Secondary | ICD-10-CM

## 2019-08-24 DIAGNOSIS — I10 Essential (primary) hypertension: Secondary | ICD-10-CM

## 2019-08-24 LAB — BAYER DCA HB A1C WAIVED: HB A1C (BAYER DCA - WAIVED): 12.1 % — ABNORMAL HIGH (ref <7.0)

## 2019-08-24 MED ORDER — SPIRONOLACTONE 25 MG PO TABS: 25.0000 mg | ORAL_TABLET | Freq: Every day | ORAL | 5 refills | Status: DC

## 2019-08-24 MED ORDER — PIOGLITAZONE HCL 30 MG PO TABS: 30.0000 mg | ORAL_TABLET | Freq: Every day | ORAL | 5 refills | Status: DC

## 2019-08-24 MED ORDER — SPIRONOLACTONE 25 MG PO TABS: 25.0000 mg | ORAL_TABLET | Freq: Every day | ORAL | 1 refills | Status: DC

## 2019-08-24 MED ORDER — GLYBURIDE 5 MG PO TABS: 5.0000 mg | ORAL_TABLET | Freq: Every day | ORAL | 5 refills | Status: DC

## 2019-08-24 MED ORDER — GABAPENTIN 300 MG PO CAPS: 300.0000 mg | ORAL_CAPSULE | Freq: Three times a day (TID) | ORAL | 1 refills | Status: DC

## 2019-08-24 MED ORDER — TRULICITY 0.75 MG/0.5ML ~~LOC~~ SOAJ: 0.7500 mg | SUBCUTANEOUS | 3 refills | Status: DC

## 2019-08-24 MED ORDER — AMITRIPTYLINE HCL 150 MG PO TABS: 150.0000 mg | ORAL_TABLET | Freq: Every day | ORAL | 1 refills | Status: DC

## 2019-08-24 MED ORDER — METOPROLOL SUCCINATE ER 50 MG PO TB24: 50.0000 mg | ORAL_TABLET | Freq: Every day | ORAL | 5 refills | Status: DC

## 2019-08-24 MED ORDER — PIOGLITAZONE HCL 45 MG PO TABS: 45.0000 mg | ORAL_TABLET | Freq: Every day | ORAL | 1 refills | Status: DC

## 2019-08-24 MED ORDER — GLYBURIDE 5 MG PO TABS: 5.0000 mg | ORAL_TABLET | Freq: Every day | ORAL | 1 refills | Status: DC

## 2019-08-24 MED ORDER — AMITRIPTYLINE HCL 100 MG PO TABS: 100.0000 mg | ORAL_TABLET | Freq: Every day | ORAL | 0 refills | Status: DC

## 2019-08-24 MED ORDER — METOPROLOL SUCCINATE ER 50 MG PO TB24: 50.0000 mg | ORAL_TABLET | Freq: Every day | ORAL | 1 refills | Status: DC

## 2019-08-24 MED ORDER — GABAPENTIN 300 MG PO CAPS: 600.0000 mg | ORAL_CAPSULE | Freq: Three times a day (TID) | ORAL | 1 refills | Status: DC

## 2019-08-24 MED ORDER — PIOGLITAZONE HCL 30 MG PO TABS: 30.0000 mg | ORAL_TABLET | Freq: Every day | ORAL | 1 refills | Status: DC

## 2019-08-24 NOTE — Patient Instructions (Signed)
Carbohydrate Counting for Diabetes Mellitus, Adult  Carbohydrate counting is a method of keeping track of how many carbohydrates you eat. Eating carbohydrates naturally increases the amount of sugar (glucose) in the blood. Counting how many carbohydrates you eat helps keep your blood glucose within normal limits, which helps you manage your diabetes (diabetes mellitus). It is important to know how many carbohydrates you can safely have in each meal. This is different for every person. A diet and nutrition specialist (registered dietitian) can help you make a meal plan and calculate how many carbohydrates you should have at each meal and snack. Carbohydrates are found in the following foods:  Grains, such as breads and cereals.  Dried beans and soy products.  Starchy vegetables, such as potatoes, peas, and corn.  Fruit and fruit juices.  Milk and yogurt.  Sweets and snack foods, such as cake, cookies, candy, chips, and soft drinks. How do I count carbohydrates? There are two ways to count carbohydrates in food. You can use either of the methods or a combination of both. Reading "Nutrition Facts" on packaged food The "Nutrition Facts" list is included on the labels of almost all packaged foods and beverages in the [place] It includes:  The serving size.  Information about nutrients in each serving, including the grams (g) of carbohydrate per serving. To use the "Nutrition Facts":  Decide how many servings you will have.  Multiply the number of servings by the number of carbohydrates per serving.  The resulting number is the total amount of carbohydrates that you will be having. Learning standard serving sizes of other foods When you eat carbohydrate foods that are not packaged or do not include "Nutrition Facts" on the label, you need to measure the servings in order to count the amount of carbohydrates:  Measure the foods that you will eat with a food scale or measuring cup, if  needed.  Decide how many standard-size servings you will eat.  Multiply the number of servings by 15. Most carbohydrate-rich foods have about 15 g of carbohydrates per serving. ? For example, if you eat 8 oz (170 g) of strawberries, you will have eaten 2 servings and 30 g of carbohydrates (2 servings x 15 g = 30 g).  For foods that have more than one food mixed, such as soups and casseroles, you must count the carbohydrates in each food that is included. The following list contains standard serving sizes of common carbohydrate-rich foods. Each of these servings has about 15 g of carbohydrates:   hamburger bun or  English muffin.   oz (15 mL) syrup.   oz (14 g) jelly.  1 slice of bread.  1 six-inch tortilla.  3 oz (85 g) cooked rice or pasta.  4 oz (113 g) cooked dried beans.  4 oz (113 g) starchy vegetable, such as peas, corn, or potatoes.  4 oz (113 g) hot cereal.  4 oz (113 g) mashed potatoes or  of a large baked potato.  4 oz (113 g) canned or frozen fruit.  4 oz (120 mL) fruit juice.  4-6 crackers.  6 chicken nuggets.  6 oz (170 g) unsweetened dry cereal.  6 oz (170 g) plain fat-free yogurt or yogurt sweetened with artificial sweeteners.  8 oz (240 mL) milk.  8 oz (170 g) fresh fruit or one small piece of fruit.  24 oz (680 g) popped popcorn. Example of carbohydrate counting Sample meal  3 oz (85 g) chicken breast.  6 oz (170 g)   brown rice.  4 oz (113 g) corn.  8 oz (240 mL) milk.  8 oz (170 g) strawberries with sugar-free whipped topping. Carbohydrate calculation 1. Identify the foods that contain carbohydrates: ? Rice. ? Corn. ? Milk. ? Strawberries. 2. Calculate how many servings you have of each food: ? 2 servings rice. ? 1 serving corn. ? 1 serving milk. ? 1 serving strawberries. 3. Multiply each number of servings by 15 g: ? 2 servings rice x 15 g = 30 g. ? 1 serving corn x 15 g = 15 g. ? 1 serving milk x 15 g = 15 g. ? 1  serving strawberries x 15 g = 15 g. 4. Add together all of the amounts to find the total grams of carbohydrates eaten: ? 30 g + 15 g + 15 g + 15 g = 75 g of carbohydrates total. Summary  Carbohydrate counting is a method of keeping track of how many carbohydrates you eat.  Eating carbohydrates naturally increases the amount of sugar (glucose) in the blood.  Counting how many carbohydrates you eat helps keep your blood glucose within normal limits, which helps you manage your diabetes.  A diet and nutrition specialist (registered dietitian) can help you make a meal plan and calculate how many carbohydrates you should have at each meal and snack. This information is not intended to replace advice given to you by your health care provider. Make sure you discuss any questions you have with your health care provider. Document Revised: 02/07/2017 Document Reviewed: 12/28/2015 Elsevier Patient Education  2020 Elsevier Inc.  

## 2019-08-25 LAB — CBC WITH DIFFERENTIAL/PLATELET
Basophils Absolute: 0.1 10*3/uL (ref 0.0–0.2)
Basos: 1 %
EOS (ABSOLUTE): 0.1 10*3/uL (ref 0.0–0.4)
Eos: 2 %
Hematocrit: 45.3 % (ref 37.5–51.0)
Hemoglobin: 15.2 g/dL (ref 13.0–17.7)
Immature Grans (Abs): 0.1 10*3/uL (ref 0.0–0.1)
Immature Granulocytes: 1 %
Lymphocytes Absolute: 3.1 10*3/uL (ref 0.7–3.1)
Lymphs: 33 %
MCH: 30.6 pg (ref 26.6–33.0)
MCHC: 33.6 g/dL (ref 31.5–35.7)
MCV: 91 fL (ref 79–97)
Monocytes Absolute: 0.7 10*3/uL (ref 0.1–0.9)
Monocytes: 7 %
Neutrophils Absolute: 5.4 10*3/uL (ref 1.4–7.0)
Neutrophils: 56 %
Platelets: 230 10*3/uL (ref 150–450)
RBC: 4.96 x10E6/uL (ref 4.14–5.80)
RDW: 13 % (ref 11.6–15.4)
WBC: 9.5 10*3/uL (ref 3.4–10.8)

## 2019-08-25 LAB — CMP14+EGFR
ALT: 36 IU/L (ref 0–44)
AST: 34 IU/L (ref 0–40)
Albumin/Globulin Ratio: 1.8 (ref 1.2–2.2)
Albumin: 4.4 g/dL (ref 3.8–4.8)
Alkaline Phosphatase: 86 IU/L (ref 39–117)
BUN/Creatinine Ratio: 12 (ref 10–24)
BUN: 10 mg/dL (ref 8–27)
Bilirubin Total: 0.8 mg/dL (ref 0.0–1.2)
CO2: 23 mmol/L (ref 20–29)
Calcium: 9.6 mg/dL (ref 8.6–10.2)
Chloride: 95 mmol/L — ABNORMAL LOW (ref 96–106)
Creatinine, Ser: 0.84 mg/dL (ref 0.76–1.27)
GFR calc Af Amer: 109 mL/min/{1.73_m2} (ref >59)
GFR calc non Af Amer: 95 mL/min/{1.73_m2} (ref >59)
Globulin, Total: 2.5 g/dL (ref 1.5–4.5)
Glucose: 274 mg/dL — ABNORMAL HIGH (ref 65–99)
Potassium: 4.5 mmol/L (ref 3.5–5.2)
Sodium: 134 mmol/L (ref 134–144)
Total Protein: 6.9 g/dL (ref 6.0–8.5)

## 2019-08-25 LAB — LIPID PANEL
Chol/HDL Ratio: 7.2 ratio — ABNORMAL HIGH (ref 0.0–5.0)
Cholesterol, Total: 231 mg/dL — ABNORMAL HIGH (ref 100–199)
HDL: 32 mg/dL — ABNORMAL LOW (ref >39)
LDL Chol Calc (NIH): 88 mg/dL (ref 0–99)
Triglycerides: 672 mg/dL (ref 0–149)
VLDL Cholesterol Cal: 111 mg/dL — ABNORMAL HIGH (ref 5–40)

## 2019-08-26 NOTE — Progress Notes (Signed)
Acute Office Visit  Subjective:    Patient ID: Shawn Malone, male    DOB: 16-Feb-1956, 64 y.o.   MRN: [id number]  Chief Complaint  Patient presents with  . Diabetes    Diabetes He presents for his follow-up diabetic visit. He has type 2 diabetes mellitus. His disease course has been worsening. Pertinent negatives for hypoglycemia include no headaches. Pertinent negatives for diabetes include no chest pain, no fatigue and no weakness. Current diabetic treatment includes oral agent (dual therapy). He is compliant with treatment some of the time. His weight is increasing steadily. He is following a generally unhealthy diet. Meal planning includes avoidance of concentrated sweets. He has not had a previous visit with a dietitian. He rarely participates in exercise. He does not see a podiatrist.Eye exam is not current.     Past Medical History:  Diagnosis Date  . Diabetes mellitus without complication ([place])   . Elevated bilirubin   . Extremity edema     Past Surgical History:  Procedure Laterality Date  . None     As of 08/10/16    Family History  Problem Relation Age of Onset  . Cancer Mother   . Colon cancer Brother 43  . Lung cancer Brother   . Liver disease Neg Hx     Social History   Socioeconomic History  . Marital status: Single    Spouse name: Not on file  . Number of children: Not on file  . Years of education: Not on file  . Highest education level: Not on file  Occupational History  . Not on file  Tobacco Use  . Smoking status: Former Smoker    Quit date: 05/22/1983    Years since quitting: 36.2  . Smokeless tobacco: Never Used  Substance and Sexual Activity  . Alcohol use: No    Alcohol/week: 30.0 standard drinks    Types: 30 Cans of beer per week    Comment: Currently no ETOH 08/10/16; Previously drank significantly  . Drug use: No  . Sexual activity: Yes    Partners: Female  Other Topics Concern  . Not on file  Social History Narrative  . Not on  file   Social Determinants of Health   Financial Resource Strain:   . Difficulty of Paying Living Expenses: Not on file  Food Insecurity:   . Worried About Charity fundraiser in the Last Year: Not on file  . Ran Out of Food in the Last Year: Not on file  Transportation Needs:   . Lack of Transportation (Medical): Not on file  . Lack of Transportation (Non-Medical): Not on file  Physical Activity:   . Days of Exercise per Week: Not on file  . Minutes of Exercise per Session: Not on file  Stress:   . Feeling of Stress : Not on file  Social Connections:   . Frequency of Communication with Friends and Family: Not on file  . Frequency of Social Gatherings with Friends and Family: Not on file  . Attends Religious Services: Not on file  . Active Member of Clubs or Organizations: Not on file  . Attends Archivist Meetings: Not on file  . Marital Status: Not on file  Intimate Partner Violence:   . Fear of Current or Ex-Partner: Not on file  . Emotionally Abused: Not on file  . Physically Abused: Not on file  . Sexually Abused: Not on file    Outpatient Medications Prior to Visit  Medication Sig  Dispense Refill  . aspirin 325 MG EC tablet Take 325 mg by mouth daily.    Marland Kitchen amitriptyline (ELAVIL) 100 MG tablet Take 1 tablet (100 mg total) by mouth at bedtime. Must have appointment and labs every 6 months 30 tablet 0  . gabapentin (NEURONTIN) 300 MG capsule Take 1-2 capsules (300-600 mg total) by mouth 3 (three) times daily. Must have appointment and labs every 6 months 180 capsule 0  . glyBURIDE (DIABETA) 5 MG tablet Take 1 tablet (5 mg total) by mouth daily with breakfast. Must have appointment and labs every 6 months 30 tablet 0  . metoprolol succinate (TOPROL-XL) 50 MG 24 hr tablet Take 1 tablet (50 mg total) by mouth daily. Take with or immediately following a meal. 30 tablet 0  . pioglitazone (ACTOS) 30 MG tablet Take 1 tablet (30 mg total) by mouth daily. Must have  appointment and labs every 6 months 30 tablet 0  . spironolactone (ALDACTONE) 25 MG tablet Take 1 tablet (25 mg total) by mouth daily. For fluid. Must have appointment and labs every 6 months 30 tablet 0   No facility-administered medications prior to visit.    Allergies  Allergen Reactions  . Metformin And Related     Stomach and feeling bad    Review of Systems  Constitutional: Negative.  Negative for appetite change and fatigue.  HENT: Negative.   Eyes: Negative.  Negative for pain and visual disturbance.  Respiratory: Negative.  Negative for cough, chest tightness, shortness of breath and wheezing.   Cardiovascular: Negative.  Negative for chest pain, palpitations and leg swelling.  Gastrointestinal: Negative.  Negative for vomiting.  Endocrine: Negative.   Genitourinary: Negative.   Musculoskeletal: Negative.   Skin: Negative.  Negative for color change and rash.  Neurological: Negative.  Negative for weakness, numbness and headaches.  Psychiatric/Behavioral: Negative.        Objective:    Physical Exam Vitals and nursing note reviewed.  Constitutional:      General: He is not in acute distress.    Appearance: He is well-developed.  HENT:     Head: Normocephalic and atraumatic.  Eyes:     Conjunctiva/sclera: Conjunctivae normal.     Pupils: Pupils are equal, round, and reactive to light.  Cardiovascular:     Rate and Rhythm: Normal rate and regular rhythm.     Heart sounds: Normal heart sounds.  Pulmonary:     Effort: Pulmonary effort is normal. No respiratory distress.     Breath sounds: Normal breath sounds.  Skin:    General: Skin is warm and dry.  Psychiatric:        Behavior: Behavior normal.    Diabetic Foot Exam - Simple   Simple Foot Form Diabetic Foot exam was performed with the following findings: Yes 08/24/2019  9:40 AM  Visual Inspection No deformities, no ulcerations, no other skin breakdown bilaterally: Yes Sensation Testing See comments:  Yes Pulse Check Posterior Tibialis and Dorsalis pulse intact bilaterally: Yes Comments Monofilament sensitive absent RIGHT first toe, second toe, heel LEFT second, third toes and lateral foot     BP 135/81   Pulse (!) 107   Temp 99.3 F (37.4 C) (Temporal)   Ht '5\' 8"'$  (1.727 m)   Wt 207 lb 12.8 oz (94.3 kg)   SpO2 99%   BMI 31.60 kg/m  Wt Readings from Last 3 Encounters:  08/24/19 207 lb 12.8 oz (94.3 kg)  07/11/18 213 lb 3.2 oz (96.7 kg)  10/18/17 201  lb 6.4 oz (91.4 kg)    Health Maintenance Due  Topic Date Due  . Hepatitis C Screening  1956-02-18  . FOOT EXAM  10/10/1967  . OPHTHALMOLOGY EXAM  10/10/1967  . HIV Screening  10/09/1972  . COLONOSCOPY  10/10/2007  . URINE MICROALBUMIN  10/19/2018    There are no preventive care reminders to display for this patient.   No results found for: TSH Lab Results  Component Value Date   WBC 9.5 08/24/2019   HGB 15.2 08/24/2019   HCT 45.3 08/24/2019   MCV 91 08/24/2019   PLT 230 08/24/2019   Lab Results  Component Value Date   NA 134 08/24/2019   K 4.5 08/24/2019   CO2 23 08/24/2019   GLUCOSE 274 (H) 08/24/2019   BUN 10 08/24/2019   CREATININE 0.84 08/24/2019   BILITOT 0.8 08/24/2019   ALKPHOS 86 08/24/2019   AST 34 08/24/2019   ALT 36 08/24/2019   PROT 6.9 08/24/2019   ALBUMIN 4.4 08/24/2019   CALCIUM 9.6 08/24/2019   ANIONGAP 7 07/25/2016   Lab Results  Component Value Date   CHOL 231 (H) 08/24/2019   Lab Results  Component Value Date   HDL 32 (L) 08/24/2019   Lab Results  Component Value Date   LDLCALC 88 08/24/2019   Lab Results  Component Value Date   TRIG 672 (HH) 08/24/2019   Lab Results  Component Value Date   CHOLHDL 7.2 (H) 08/24/2019   Lab Results  Component Value Date   HGBA1C 12.1 (H) 08/24/2019       Assessment & Plan:   Problem List Items Addressed This Visit      Endocrine   Diabetes, 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, uncontrolled, managed as type 2 ([place]) - Primary   Relevant Medications    glyBURIDE (DIABETA) 5 MG tablet   pioglitazone (ACTOS) 45 MG tablet   Dulaglutide (TRULICITY) 3.57 SV/7.7LT SOPN     Nervous and Auditory   Neuropathy   Relevant Medications   amitriptyline (ELAVIL) 150 MG tablet    Other Visit Diagnoses    Accelerated hypertension       Relevant Medications   metoprolol succinate (TOPROL-XL) 50 MG 24 hr tablet   spironolactone (ALDACTONE) 25 MG tablet   Other Relevant Orders   CBC with Differential/Platelet (Completed)   CMP14+EGFR (Completed)   Lipid panel (Completed)   Tachycardia with hypertension       Relevant Medications   metoprolol succinate (TOPROL-XL) 50 MG 24 hr tablet       Meds ordered this encounter  Medications  . DISCONTD: amitriptyline (ELAVIL) 100 MG tablet    Sig: Take 1 tablet (100 mg total) by mouth at bedtime.    Dispense:  30 tablet    Refill:  0    Order Specific Question:   Supervising Provider    Answer:   Janora Norlander [[id number]]  . DISCONTD: gabapentin (NEURONTIN) 300 MG capsule    Sig: Take 1-2 capsules (300-600 mg total) by mouth 3 (three) times daily.    Dispense:  180 capsule    Refill:  1    Order Specific Question:   Supervising Provider    Answer:   Janora Norlander [[id number]]  . DISCONTD: glyBURIDE (DIABETA) 5 MG tablet    Sig: Take 1 tablet (5 mg total) by mouth daily with breakfast. Must have appointment and labs every 6 months    Dispense:  30 tablet    Refill:  5    Order Specific  Question:   Supervising Provider    Answer:   Janora Norlander [[id number]]  . DISCONTD: metoprolol succinate (TOPROL-XL) 50 MG 24 hr tablet    Sig: Take 1 tablet (50 mg total) by mouth daily. Take with or immediately following a meal.    Dispense:  30 tablet    Refill:  5    Order Specific Question:   Supervising Provider    Answer:   Janora Norlander [[id number]]  . DISCONTD: pioglitazone (ACTOS) 30 MG tablet    Sig: Take 1 tablet (30 mg total) by mouth daily. Must have appointment and labs every 6 months     Dispense:  30 tablet    Refill:  5    Order Specific Question:   Supervising Provider    Answer:   Janora Norlander [[id number]]  . DISCONTD: spironolactone (ALDACTONE) 25 MG tablet    Sig: Take 1 tablet (25 mg total) by mouth daily. For fluid. Must have appointment and labs every 6 months    Dispense:  30 tablet    Refill:  5    Order Specific Question:   Supervising Provider    Answer:   Janora Norlander [[id number]]  . gabapentin (NEURONTIN) 300 MG capsule    Sig: Take 2-3 capsules (600-900 mg total) by mouth 3 (three) times daily.    Dispense:  240 capsule    Refill:  1    Order Specific Question:   Supervising Provider    Answer:   Janora Norlander [[id number]]  . amitriptyline (ELAVIL) 150 MG tablet    Sig: Take 1 tablet (150 mg total) by mouth at bedtime.    Dispense:  90 tablet    Refill:  1    Order Specific Question:   Supervising Provider    Answer:   Janora Norlander [[id number]]  . metoprolol succinate (TOPROL-XL) 50 MG 24 hr tablet    Sig: Take 1 tablet (50 mg total) by mouth daily. Take with or immediately following a meal.    Dispense:  90 tablet    Refill:  1    Order Specific Question:   Supervising Provider    Answer:   Janora Norlander [[id number]]  . glyBURIDE (DIABETA) 5 MG tablet    Sig: Take 1 tablet (5 mg total) by mouth daily with breakfast.    Dispense:  90 tablet    Refill:  1    Order Specific Question:   Supervising Provider    Answer:   Janora Norlander [[id number]]  . DISCONTD: pioglitazone (ACTOS) 30 MG tablet    Sig: Take 1 tablet (30 mg total) by mouth daily.    Dispense:  90 tablet    Refill:  1    Order Specific Question:   Supervising Provider    Answer:   Janora Norlander [[id number]]  . spironolactone (ALDACTONE) 25 MG tablet    Sig: Take 1 tablet (25 mg total) by mouth daily.    Dispense:  90 tablet    Refill:  1    Order Specific Question:   Supervising Provider    Answer:   Janora Norlander [[id number]]  . pioglitazone  (ACTOS) 45 MG tablet    Sig: Take 1 tablet (45 mg total) by mouth daily.    Dispense:  90 tablet    Refill:  1    Order Specific Question:   Supervising Provider    Answer:   Janora Norlander [[id number]]  . Dulaglutide (TRULICITY) 8.09  MG/0.5ML SOPN    Sig: Inject 0.75 mg into the skin once a week.    Dispense:  12 pen    Refill:  3    Order Specific Question:   Supervising Provider    Answer:   Janora Norlander [[id number]]     Shawn Sleeper, PA-C

## 2019-08-31 ENCOUNTER — Other Ambulatory Visit: Payer: Self-pay | Admitting: Physician Assistant

## 2019-08-31 MED ORDER — GEMFIBROZIL 600 MG PO TABS: 600.0000 mg | ORAL_TABLET | Freq: Two times a day (BID) | ORAL | 5 refills | Status: DC

## 2019-09-10 ENCOUNTER — Telehealth: Payer: Self-pay | Admitting: Physician Assistant

## 2019-09-10 NOTE — Telephone Encounter (Signed)
Informed no samples available.  I also looked for the form from the health department and could not find it.  I asked them to contact health department and ask them to re-fax.

## 2019-09-11 ENCOUNTER — Other Ambulatory Visit: Payer: Self-pay | Admitting: Physician Assistant

## 2019-11-23 ENCOUNTER — Ambulatory Visit: Payer: Self-pay | Admitting: Physician Assistant

## 2019-11-26 ENCOUNTER — Ambulatory Visit (INDEPENDENT_AMBULATORY_CARE_PROVIDER_SITE_OTHER): Payer: Self-pay | Admitting: Nurse Practitioner

## 2019-11-26 ENCOUNTER — Other Ambulatory Visit: Payer: Self-pay

## 2019-11-26 ENCOUNTER — Encounter: Payer: Self-pay | Admitting: Nurse Practitioner

## 2019-11-26 VITALS — BP 113/77 | HR 78 | Temp 98.4°F | Ht 68.0 in | Wt 216.1 lb

## 2019-11-26 DIAGNOSIS — N521 Erectile dysfunction due to diseases classified elsewhere: Secondary | ICD-10-CM

## 2019-11-26 DIAGNOSIS — F5101 Primary insomnia: Secondary | ICD-10-CM

## 2019-11-26 DIAGNOSIS — E1169 Type 2 diabetes mellitus with other specified complication: Secondary | ICD-10-CM | POA: Insufficient documentation

## 2019-11-26 DIAGNOSIS — I10 Essential (primary) hypertension: Secondary | ICD-10-CM

## 2019-11-26 DIAGNOSIS — E119 Type 2 diabetes mellitus without complications: Secondary | ICD-10-CM | POA: Insufficient documentation

## 2019-11-26 DIAGNOSIS — E1159 Type 2 diabetes mellitus with other circulatory complications: Secondary | ICD-10-CM

## 2019-11-26 DIAGNOSIS — E785 Hyperlipidemia, unspecified: Secondary | ICD-10-CM | POA: Insufficient documentation

## 2019-11-26 DIAGNOSIS — E1365 Other specified diabetes mellitus with hyperglycemia: Secondary | ICD-10-CM

## 2019-11-26 DIAGNOSIS — IMO0002 Reserved for concepts with insufficient information to code with codable children: Secondary | ICD-10-CM

## 2019-11-26 DIAGNOSIS — Z1159 Encounter for screening for other viral diseases: Secondary | ICD-10-CM

## 2019-11-26 DIAGNOSIS — R000 Tachycardia, unspecified: Secondary | ICD-10-CM

## 2019-11-26 DIAGNOSIS — G629 Polyneuropathy, unspecified: Secondary | ICD-10-CM

## 2019-11-26 DIAGNOSIS — I152 Hypertension secondary to endocrine disorders: Secondary | ICD-10-CM

## 2019-11-26 LAB — BAYER DCA HB A1C WAIVED: HB A1C (BAYER DCA - WAIVED): 8.2 % — ABNORMAL HIGH (ref <7.0)

## 2019-11-26 MED ORDER — GABAPENTIN 300 MG PO CAPS: 600.0000 mg | ORAL_CAPSULE | Freq: Three times a day (TID) | ORAL | 1 refills | Status: DC

## 2019-11-26 MED ORDER — AMITRIPTYLINE HCL 150 MG PO TABS: 150.0000 mg | ORAL_TABLET | Freq: Every day | ORAL | 1 refills | Status: DC

## 2019-11-26 MED ORDER — GLYBURIDE 5 MG PO TABS: 5.0000 mg | ORAL_TABLET | Freq: Every day | ORAL | 1 refills | Status: DC

## 2019-11-26 MED ORDER — METOPROLOL SUCCINATE ER 50 MG PO TB24: 50.0000 mg | ORAL_TABLET | Freq: Every day | ORAL | 1 refills | Status: DC

## 2019-11-26 MED ORDER — SPIRONOLACTONE 25 MG PO TABS: 25.0000 mg | ORAL_TABLET | Freq: Every day | ORAL | 1 refills | Status: DC

## 2019-11-26 MED ORDER — TRULICITY 0.75 MG/0.5ML ~~LOC~~ SOAJ: 0.7500 mg | SUBCUTANEOUS | 3 refills | Status: DC

## 2019-11-26 MED ORDER — GEMFIBROZIL 600 MG PO TABS: 600.0000 mg | ORAL_TABLET | Freq: Two times a day (BID) | ORAL | 5 refills | Status: DC

## 2019-11-26 MED ORDER — PIOGLITAZONE HCL 45 MG PO TABS: 45.0000 mg | ORAL_TABLET | Freq: Every day | ORAL | 1 refills | Status: DC

## 2019-11-26 MED ORDER — SILDENAFIL CITRATE 20 MG PO TABS: 20.0000 mg | ORAL_TABLET | Freq: Three times a day (TID) | ORAL | 5 refills | Status: DC

## 2019-11-26 NOTE — Patient Instructions (Signed)
Diabetes Mellitus and Foot Care Foot care is an important part of your health, especially when you have diabetes. Diabetes may cause you to have problems because of poor blood flow (circulation) to your feet and legs, which can cause your skin to:  Become thinner and drier.  Break more easily.  Heal more slowly.  Peel and crack. You may also have nerve damage (neuropathy) in your legs and feet, causing decreased feeling in them. This means that you may not notice minor injuries to your feet that could lead to more serious problems. Noticing and addressing any potential problems early is the best way to prevent future foot problems. How to care for your feet Foot hygiene  Wash your feet daily with warm water and mild soap. Do not use hot water. Then, pat your feet and the areas between your toes until they are completely dry. Do not soak your feet as this can dry your skin.  Trim your toenails straight across. Do not dig under them or around the cuticle. File the edges of your nails with an emery board or nail file.  Apply a moisturizing lotion or petroleum jelly to the skin on your feet and to dry, brittle toenails. Use lotion that does not contain alcohol and is unscented. Do not apply lotion between your toes. Shoes and socks  Wear clean socks or stockings every day. Make sure they are not too tight. Do not wear knee-high stockings since they may decrease blood flow to your legs.  Wear shoes that fit properly and have enough cushioning. Always look in your shoes before you put them on to be sure there are no objects inside.  To break in new shoes, wear them for just a few hours a day. This prevents injuries on your feet. Wounds, scrapes, corns, and calluses  Check your feet daily for blisters, cuts, bruises, sores, and redness. If you cannot see the bottom of your feet, use a mirror or ask someone for help.  Do not cut corns or calluses or try to remove them with medicine.  If you  find a minor scrape, cut, or break in the skin on your feet, keep it and the skin around it clean and dry. You may clean these areas with mild soap and water. Do not clean the area with peroxide, alcohol, or iodine.  If you have a wound, scrape, corn, or callus on your foot, look at it several times a day to make sure it is healing and not infected. Check for: ? Redness, swelling, or pain. ? Fluid or blood. ? Warmth. ? Pus or a bad smell. General instructions  Do not cross your legs. This may decrease blood flow to your feet.  Do not use heating pads or hot water bottles on your feet. They may burn your skin. If you have lost feeling in your feet or legs, you may not know this is happening until it is too late.  Protect your feet from hot and cold by wearing shoes, such as at the beach or on hot pavement.  Schedule a complete foot exam at least once a year (annually) or more often if you have foot problems. If you have foot problems, report any cuts, sores, or bruises to your health care provider immediately. Contact a health care provider if:  You have a medical condition that increases your risk of infection and you have any cuts, sores, or bruises on your feet.  You have an injury that is not   healing.  You have redness on your legs or feet.  You feel burning or tingling in your legs or feet.  You have pain or cramps in your legs and feet.  Your legs or feet are numb.  Your feet always feel cold.  You have pain around a toenail. Get help right away if:  You have a wound, scrape, corn, or callus on your foot and: ? You have pain, swelling, or redness that gets worse. ? You have fluid or blood coming from the wound, scrape, corn, or callus. ? Your wound, scrape, corn, or callus feels warm to the touch. ? You have pus or a bad smell coming from the wound, scrape, corn, or callus. ? You have a fever. ? You have a red line going up your leg. Summary  Check your feet every day  for cuts, sores, red spots, swelling, and blisters.  Moisturize feet and legs daily.  Wear shoes that fit properly and have enough cushioning.  If you have foot problems, report any cuts, sores, or bruises to your health care provider immediately.  Schedule a complete foot exam at least once a year (annually) or more often if you have foot problems. This information is not intended to replace advice given to you by your health care provider. Make sure you discuss any questions you have with your health care provider. Document Revised: 04/08/2019 Document Reviewed: 08/17/2016 Elsevier Patient Education  2020 [employer]  

## 2019-11-26 NOTE — Progress Notes (Signed)
Subjective:    Patient ID: Shawn Malone, male    DOB: [birth year], 64 y.o.   MRN: [id number]   Chief Complaint: Medical Management of Chronic Issues    HPI:  1. Type 2 diabetes mellitus without complication, without long-term current use of insulin (HCC) Was prescribed Trulicity at last visit but was not able to get it for 5 weeks. Checks blood sugars at home sometimes and says they range around 150. Does not watch carb or sugar intake. Has numbness and tingling in feet and is starting to feel that in his hands. Has noticed a little bit of blurriness especially up close. Has not seen an eye doctor in a long time. Does check his feet every night and says they are good except he has a small spot on the inside of his left leg near the ankle for the last 2 weeks. States it has gotten better. Lab Results  Component Value Date   HGBA1C 12.1 (H) 08/24/2019    2. Neuropathy Takes gabapentin three times a day and says it does help some but he is still having problems.  3. Primary insomnia Has not been having problems sleeping since starting the amitriptyline.  4. [place] disease Has not noticed any yellowing of the skin, nausea, or abdominal pain.  5. Hypertension Does not check BP at home and does not watch sodium in diet. Denies chest pain, sob, headaches. BP Readings from Last 3 Encounters:  11/26/19 113/77  08/24/19 135/81  07/11/18 130/79   6. Hyperlipidemia Does not watch fat or cholesterol in diet. Does not exercise but does stay active at home.   Outpatient Encounter Medications as of 11/26/2019  Medication Sig  . amitriptyline (ELAVIL) 150 MG tablet Take 1 tablet (150 mg total) by mouth at bedtime.  Marland Kitchen aspirin 325 MG EC tablet Take 325 mg by mouth daily.  . Dulaglutide (TRULICITY) 0.75 MG/0.5ML SOPN Inject 0.75 mg into the skin once a week.  . gabapentin (NEURONTIN) 300 MG capsule Take 2-3 capsules (600-900 mg total) by mouth 3 (three) times daily.  Marland Kitchen gemfibrozil (LOPID) 600  MG tablet Take 1 tablet (600 mg total) by mouth 2 (two) times daily before a meal.  . glyBURIDE (DIABETA) 5 MG tablet Take 1 tablet (5 mg total) by mouth daily with breakfast.  . metoprolol succinate (TOPROL-XL) 50 MG 24 hr tablet Take 1 tablet (50 mg total) by mouth daily. Take with or immediately following a meal.  . pioglitazone (ACTOS) 45 MG tablet Take 1 tablet (45 mg total) by mouth daily.  Marland Kitchen spironolactone (ALDACTONE) 25 MG tablet Take 1 tablet (25 mg total) by mouth daily.   No facility-administered encounter medications on file as of 11/26/2019.    Past Surgical History:  Procedure Laterality Date  . None     As of 08/10/16    Family History  Problem Relation Age of Onset  . Cancer Mother   . Colon cancer Brother 48  . Lung cancer Brother   . Liver disease Neg Hx     New complaints: Has been having issues with sexual dysfunction.  Social history: Lives with long-term girlfriend.   Controlled substance contract: N/A     Review of Systems  Constitutional: Negative.   HENT: Negative.   Eyes: Positive for visual disturbance.  Respiratory: Negative.   Cardiovascular: Negative.   Gastrointestinal: Negative.   Endocrine: Negative.   Genitourinary:       Erectile dysfunction  Musculoskeletal: Negative.   Skin: Positive for wound (  inside of left leg near ankle).  Allergic/Immunologic: Negative.   Neurological: Positive for numbness (and tingling in feet and some in hands).  Hematological: Negative.   Psychiatric/Behavioral: Negative.        Objective:   Physical Exam Vitals and nursing note reviewed.  HENT:     Head: Normocephalic.     Right Ear: Tympanic membrane normal.     Left Ear: Tympanic membrane normal.     Nose: Nose normal.     Mouth/Throat:     Mouth: Mucous membranes are moist.     Pharynx: Oropharynx is clear.  Eyes:     Conjunctiva/sclera: Conjunctivae normal.     Pupils: Pupils are equal, round, and reactive to light.  Cardiovascular:      Rate and Rhythm: Normal rate and regular rhythm.     Pulses: Normal pulses.     Heart sounds: Normal heart sounds.  Pulmonary:     Effort: Pulmonary effort is normal.     Breath sounds: Normal breath sounds.  Abdominal:     General: Bowel sounds are normal.     Palpations: Abdomen is soft.  Musculoskeletal:     Cervical back: Normal range of motion and neck supple.  Feet:     Right foot:     Protective Sensation: 10 sites tested. 4 sites sensed.     Left foot:     Protective Sensation: 10 sites tested. 5 sites sensed.     Skin integrity: Skin breakdown (small circular abrasion to left leg near inside ankle) present.  Skin:    General: Skin is warm and dry.     Capillary Refill: Capillary refill takes less than 2 seconds.     Findings: Abrasion (left leg) present.  Neurological:     General: No focal deficit present.     Mental Status: He is alert and oriented to person, place, and time.  Psychiatric:        Behavior: Behavior normal.   BP 113/77   Pulse 78   Temp 98.4 F (36.9 C) (Temporal)   Ht 5\' 8"  (1.727 m)   Wt 216 lb 2 oz (98 kg)   BMI 32.86 kg/m      Assessment & Plan:  Shawn Malone comes in today with chief complaint of Medical Management of Chronic Issues   Diagnosis and orders addressed:  1. Type 2 diabetes mellitus without complication, without long-term current use of insulin (HCC) Keep diary of blood sugars Strict carb counting Schedule eye exam - Microalbumin / creatinine urine ratio - Bayer DCA Hb A1c Waived - Dulaglutide (TRULICITY) 0.75 MG/0.5ML SOPN; Inject 0.75 mg into the skin once a week.  Dispense: 12 pen; Refill: 3 - glyBURIDE (DIABETA) 5 MG tablet; Take 1 tablet (5 mg total) by mouth daily with breakfast.  Dispense: 90 tablet; Refill: 1 - pioglitazone (ACTOS) 45 MG tablet; Take 1 tablet (45 mg total) by mouth daily.  Dispense: 90 tablet; Refill: 1  2. Neuropathy - gabapentin (NEURONTIN) 300 MG capsule; Take 2-3 capsules (600-900 mg  total) by mouth 3 (three) times daily.  Dispense: 240 capsule; Refill: 1 - amitriptyline (ELAVIL) 150 MG tablet; Take 1 tablet (150 mg total) by mouth at bedtime.  Dispense: 90 tablet; Refill: 1  3. Primary insomnia Continue amitriptyline at bedtime  4. Gilbert disease  5. Hyperlipidemia associated with type 2 diabetes mellitus (HCC) Low fat/low cholesterol diet Increase physical activity  6. Erectile dysfunction associated with type 2 diabetes mellitus (HCC) Added sildenafil to meds -  sildenafil (REVATIO) 20 MG tablet; Take 1-2 tablets (20-40 mg total) by mouth 3 (three) times daily.  Dispense: 20 tablet; Refill: 5  7. Accelerated hypertension Low sodium diet - metoprolol succinate (TOPROL-XL) 50 MG 24 hr tablet; Take 1 tablet (50 mg total) by mouth daily. Take with or immediately following a meal.  Dispense: 90 tablet; Refill: 1 - spironolactone (ALDACTONE) 25 MG tablet; Take 1 tablet (25 mg total) by mouth daily.  Dispense: 90 tablet; Refill: 1  8. Tachycardia with hypertension - metoprolol succinate (TOPROL-XL) 50 MG 24 hr tablet; Take 1 tablet (50 mg total) by mouth daily. Take with or immediately following a meal.  Dispense: 90 tablet; Refill: 1  9. Need for hepatitis C screening test - Hepatitis C antibody   Labs pending Health Maintenance reviewed Diet and exercise encouraged  Follow up plan: 3 months   Mary-Margaret Hassell Done, FNP

## 2019-11-27 LAB — MICROALBUMIN / CREATININE URINE RATIO
Creatinine, Urine: 202.3 mg/dL
Microalb/Creat Ratio: 4 mg/g creat (ref 0–29)
Microalbumin, Urine: 7.6 ug/mL

## 2019-11-27 LAB — HEPATITIS C ANTIBODY: Hep C Virus Ab: <0.1 s/co ratio (ref 0.0–0.9)

## 2019-12-07 ENCOUNTER — Telehealth: Payer: Self-pay | Admitting: Nurse Practitioner

## 2019-12-07 NOTE — Telephone Encounter (Signed)
Shawn Malone started him on trulicity in [place] it took him several week to get it. I jut gave him ample to help with expense. Did he have problem with it when he first started taking prior to seeing me?

## 2019-12-07 NOTE — Telephone Encounter (Signed)
HOME PHONE BUSY NO ANSWER ON MOBILE

## 2019-12-07 NOTE — Telephone Encounter (Signed)
Top th e4.5 and there are ample of 0.75mg  for him to pick up. Hi hgba1c ought to be real good!!

## 2019-12-07 NOTE — Telephone Encounter (Signed)
Patient states that Lawanna Kobus had him on Trulicity 0.75 and the sample he was given was 4.5.  Patient states he was fine when he was on the 0.75.  States after a few days of the 4.5 he started having N/V & diarrhea.  Patient is now back on the 0.75 and states he is doing much better.

## 2019-12-07 NOTE — Telephone Encounter (Signed)
CALLED PATIENT, BUSY SIGNAL X 2 

## 2019-12-07 NOTE — Telephone Encounter (Signed)
Pts wife returned missed call from [facility] regarding Insulin samples. Made pts wife aware that per MMM, pt needs to stop taking the 4.5mg  and come pick up samples of 0.75mg . Wife voiced understanding and said they would come pick up samples on Thursday 12/10/19.

## 2020-02-25 ENCOUNTER — Other Ambulatory Visit: Payer: Self-pay

## 2020-02-25 ENCOUNTER — Encounter: Payer: Self-pay | Admitting: Nurse Practitioner

## 2020-02-25 ENCOUNTER — Ambulatory Visit (INDEPENDENT_AMBULATORY_CARE_PROVIDER_SITE_OTHER): Payer: Self-pay | Admitting: Nurse Practitioner

## 2020-02-25 ENCOUNTER — Telehealth: Payer: Self-pay | Admitting: Pharmacist

## 2020-02-25 VITALS — BP 109/74 | HR 102 | Temp 97.0°F | Resp 20 | Ht 68.0 in | Wt 219.0 lb

## 2020-02-25 DIAGNOSIS — E119 Type 2 diabetes mellitus without complications: Secondary | ICD-10-CM

## 2020-02-25 DIAGNOSIS — I152 Hypertension secondary to endocrine disorders: Secondary | ICD-10-CM

## 2020-02-25 DIAGNOSIS — E1159 Type 2 diabetes mellitus with other circulatory complications: Secondary | ICD-10-CM

## 2020-02-25 DIAGNOSIS — R000 Tachycardia, unspecified: Secondary | ICD-10-CM

## 2020-02-25 DIAGNOSIS — E785 Hyperlipidemia, unspecified: Secondary | ICD-10-CM

## 2020-02-25 DIAGNOSIS — E1169 Type 2 diabetes mellitus with other specified complication: Secondary | ICD-10-CM

## 2020-02-25 DIAGNOSIS — I10 Essential (primary) hypertension: Secondary | ICD-10-CM

## 2020-02-25 DIAGNOSIS — G629 Polyneuropathy, unspecified: Secondary | ICD-10-CM

## 2020-02-25 DIAGNOSIS — F5101 Primary insomnia: Secondary | ICD-10-CM

## 2020-02-25 DIAGNOSIS — N521 Erectile dysfunction due to diseases classified elsewhere: Secondary | ICD-10-CM

## 2020-02-25 LAB — BAYER DCA HB A1C WAIVED: HB A1C (BAYER DCA - WAIVED): 7.2 % — ABNORMAL HIGH (ref <7.0)

## 2020-02-25 MED ORDER — TRULICITY 0.75 MG/0.5ML ~~LOC~~ SOAJ: 0.7500 mg | SUBCUTANEOUS | 3 refills | Status: DC

## 2020-02-25 MED ORDER — GEMFIBROZIL 600 MG PO TABS: 600.0000 mg | ORAL_TABLET | Freq: Two times a day (BID) | ORAL | 5 refills | Status: DC

## 2020-02-25 MED ORDER — GLYBURIDE 5 MG PO TABS: 5.0000 mg | ORAL_TABLET | Freq: Every day | ORAL | 1 refills | Status: DC

## 2020-02-25 MED ORDER — SILDENAFIL CITRATE 20 MG PO TABS: 20.0000 mg | ORAL_TABLET | Freq: Three times a day (TID) | ORAL | 5 refills | Status: DC

## 2020-02-25 MED ORDER — GABAPENTIN 300 MG PO CAPS: 600.0000 mg | ORAL_CAPSULE | Freq: Three times a day (TID) | ORAL | 1 refills | Status: DC

## 2020-02-25 MED ORDER — PIOGLITAZONE HCL 45 MG PO TABS: 45.0000 mg | ORAL_TABLET | Freq: Every day | ORAL | 1 refills | Status: DC

## 2020-02-25 MED ORDER — AMITRIPTYLINE HCL 150 MG PO TABS: 150.0000 mg | ORAL_TABLET | Freq: Every day | ORAL | 1 refills | Status: DC

## 2020-02-25 MED ORDER — METOPROLOL SUCCINATE ER 50 MG PO TB24: 50.0000 mg | ORAL_TABLET | Freq: Every day | ORAL | 1 refills | Status: DC

## 2020-02-25 MED ORDER — SPIRONOLACTONE 25 MG PO TABS: 25.0000 mg | ORAL_TABLET | Freq: Every day | ORAL | 1 refills | Status: DC

## 2020-02-25 MED ORDER — GABAPENTIN 300 MG PO CAPS: 600.0000 mg | ORAL_CAPSULE | Freq: Three times a day (TID) | ORAL | 3 refills | Status: DC

## 2020-02-25 NOTE — Progress Notes (Signed)
Subjective:    Patient ID: Shawn Malone, male    DOB: [birth year], 64 y.o.   MRN: [id number]   Chief Complaint: Medical Management of Chronic Issues    HPI:  1. Type 2 diabetes mellitus without complication, without long-term current use of insulin (HCC) Fasting blood sugars are running 130-150 fasting in mornings. deneis any low blood sugars. No change swere made at last appointment, other then encouraging low carb diet. Lab Results  Component Value Date   HGBA1C 8.2 (H) 11/26/2019     2. Hypertension associated with diabetes ([place]) No c/o chest pain, sob or headache. Does not check blood pressure at home. BP Readings from Last 3 Encounters:  02/25/20 109/74  11/26/19 113/77  08/24/19 135/81     3. Hyperlipidemia associated with type 2 diabetes mellitus ([place]) Tries to watch diet but does no dedicated exercise. Lab Results  Component Value Date   CHOL 231 (H) 08/24/2019   HDL 32 (L) 08/24/2019   LDLCALC 88 08/24/2019   TRIG 672 (HH) 08/24/2019   CHOLHDL 7.2 (H) 08/24/2019   The 10-year ASCVD risk score Mikey Bussing DC Jr., et al., 2013) is: 24.3%   Values used to calculate the score:     Age: 63 years     Sex: Male     Is Non-Hispanic African American: No     Diabetic: Yes     Tobacco smoker: No     Systolic Blood Pressure: 638 mmHg     Is BP treated: Yes     HDL Cholesterol: 32 mg/dL     Total Cholesterol: 231 mg/dL   4. Neuropathy Has constant burning in hands and feet.   5. Primary insomnia Sleeping well. Sleeps about 8 hours a night.    Outpatient Encounter Medications as of 02/25/2020  Medication Sig   amitriptyline (ELAVIL) 150 MG tablet Take 1 tablet (150 mg total) by mouth at bedtime.   aspirin 325 MG EC tablet Take 325 mg by mouth daily.   Dulaglutide (TRULICITY) 4.66 ZL/9.3TT SOPN Inject 0.75 mg into the skin once a week.   gabapentin (NEURONTIN) 300 MG capsule Take 2-3 capsules (600-900 mg total) by mouth 3 (three) times daily.   glyBURIDE  (DIABETA) 5 MG tablet Take 1 tablet (5 mg total) by mouth daily with breakfast.   metoprolol succinate (TOPROL-XL) 50 MG 24 hr tablet Take 1 tablet (50 mg total) by mouth daily. Take with or immediately following a meal.   pioglitazone (ACTOS) 45 MG tablet Take 1 tablet (45 mg total) by mouth daily.   sildenafil (REVATIO) 20 MG tablet Take 1-2 tablets (20-40 mg total) by mouth 3 (three) times daily.   spironolactone (ALDACTONE) 25 MG tablet Take 1 tablet (25 mg total) by mouth daily.   gemfibrozil (LOPID) 600 MG tablet Take 1 tablet (600 mg total) by mouth 2 (two) times daily before a meal. (Patient not taking: Reported on 02/25/2020)     Past Surgical History:  Procedure Laterality Date   None     As of 08/10/16    Family History  Problem Relation Age of Onset   Cancer Mother    Colon cancer Brother 62   Lung cancer Brother    Liver disease Neg Hx     New complaints: None today  Social history: Lives with his girlfriend. He is retired  Controlled substance contract: n/a    Review of Systems  Constitutional: Negative for diaphoresis.  Eyes: Negative for pain.  Respiratory: Negative for shortness of  breath.   Cardiovascular: Negative for chest pain, palpitations and leg swelling.  Gastrointestinal: Negative for abdominal pain.  Endocrine: Negative for polydipsia.  Skin: Negative for rash.  Neurological: Negative for dizziness, weakness and headaches.  Hematological: Does not bruise/bleed easily.  All other systems reviewed and are negative.      Objective:   Physical Exam Vitals and nursing note reviewed.  Constitutional:      Appearance: Normal appearance. He is well-developed.  HENT:     Head: Normocephalic.     Nose: Nose normal.  Eyes:     Pupils: Pupils are equal, round, and reactive to light.  Neck:     Thyroid: No thyroid mass or thyromegaly.     Vascular: No carotid bruit or JVD.     Trachea: Phonation normal.  Cardiovascular:     Rate and  Rhythm: Normal rate and regular rhythm.  Pulmonary:     Effort: Pulmonary effort is normal. No respiratory distress.     Breath sounds: Normal breath sounds.  Abdominal:     General: Bowel sounds are normal.     Palpations: Abdomen is soft.     Tenderness: There is no abdominal tenderness.  Musculoskeletal:        General: Normal range of motion.     Cervical back: Normal range of motion and neck supple.  Lymphadenopathy:     Cervical: No cervical adenopathy.  Skin:    General: Skin is warm and dry.  Neurological:     Mental Status: He is alert and oriented to person, place, and time.  Psychiatric:        Behavior: Behavior normal.        Thought Content: Thought content normal.        Judgment: Judgment normal.    BP 109/74    Pulse 102    Temp (!) 97 F (36.1 C) (Temporal)    Resp 20    Ht '5\' 8"'$  (1.727 m)    Wt (!) 219 lb (99.3 kg)    SpO2 97%    BMI 33.30 kg/m   HGBA1c 7.2%       Assessment & Plan:  Shawn Malone comes in today with chief complaint of Medical Management of Chronic Issues   Diagnosis and orders addressed:  1. Type 2 diabetes mellitus without complication, without long-term current use of insulin (HCC) Continue to watch carbs in diet - Bayer DCA Hb A1c Waived - glyBURIDE (DIABETA) 5 MG tablet; Take 1 tablet (5 mg total) by mouth daily with breakfast.  Dispense: 90 tablet; Refill: 1 - pioglitazone (ACTOS) 45 MG tablet; Take 1 tablet (45 mg total) by mouth daily.  Dispense: 90 tablet; Refill: 1 - Dulaglutide (TRULICITY) 4.25 ZD/6.3OV SOPN; Inject 0.5 mLs (0.75 mg total) into the skin once a week.  Dispense: 12 pen; Refill: 3  2. Hypertension associated with diabetes ([place]) Low sodium diet - CBC with Differential/Platelet - CMP14+EGFR  3. Hyperlipidemia associated with type 2 diabetes mellitus (HCC) Low fat diet - Lipid panel  4. Neuropathy Do no go barefooted - gabapentin (NEURONTIN) 300 MG capsule; Take 2-3 capsules (600-900 mg total) by mouth 3  (three) times daily.  Dispense: 240 capsule; Refill: 1 - amitriptyline (ELAVIL) 150 MG tablet; Take 1 tablet (150 mg total) by mouth at bedtime.  Dispense: 90 tablet; Refill: 1  5. Primary insomnia Bedtime routine  6. Accelerated hypertension - spironolactone (ALDACTONE) 25 MG tablet; Take 1 tablet (25 mg total) by mouth daily.  Dispense: 90  tablet; Refill: 1 - metoprolol succinate (TOPROL-XL) 50 MG 24 hr tablet; Take 1 tablet (50 mg total) by mouth daily. Take with or immediately following a meal.  Dispense: 90 tablet; Refill: 1  7. Tachycardia with hypertension - metoprolol succinate (TOPROL-XL) 50 MG 24 hr tablet; Take 1 tablet (50 mg total) by mouth daily. Take with or immediately following a meal.  Dispense: 90 tablet; Refill: 1  8. Erectile dysfunction associated with type 2 diabetes mellitus (HCC) - sildenafil (REVATIO) 20 MG tablet; Take 1-2 tablets (20-40 mg total) by mouth 3 (three) times daily.  Dispense: 20 tablet; Refill: 5   Labs pending Health Maintenance reviewed Diet and exercise encouraged  Follow up plan: 3 months   Mary-Margaret Hassell Done, FNP

## 2020-02-25 NOTE — Telephone Encounter (Signed)
Trulicity and gabapentin RX hardcopies faxed to [facility] Fax#[phone]

## 2020-02-25 NOTE — Patient Instructions (Signed)
Diabetes Mellitus and Foot Care Foot care is an important part of your health, especially when you have diabetes. Diabetes may cause you to have problems because of poor blood flow (circulation) to your feet and legs, which can cause your skin to:  Become thinner and drier.  Break more easily.  Heal more slowly.  Peel and crack. You may also have nerve damage (neuropathy) in your legs and feet, causing decreased feeling in them. This means that you may not notice minor injuries to your feet that could lead to more serious problems. Noticing and addressing any potential problems early is the best way to prevent future foot problems. How to care for your feet Foot hygiene  Wash your feet daily with warm water and mild soap. Do not use hot water. Then, pat your feet and the areas between your toes until they are completely dry. Do not soak your feet as this can dry your skin.  Trim your toenails straight across. Do not dig under them or around the cuticle. File the edges of your nails with an emery board or nail file.  Apply a moisturizing lotion or petroleum jelly to the skin on your feet and to dry, brittle toenails. Use lotion that does not contain alcohol and is unscented. Do not apply lotion between your toes. Shoes and socks  Wear clean socks or stockings every day. Make sure they are not too tight. Do not wear knee-high stockings since they may decrease blood flow to your legs.  Wear shoes that fit properly and have enough cushioning. Always look in your shoes before you put them on to be sure there are no objects inside.  To break in new shoes, wear them for just a few hours a day. This prevents injuries on your feet. Wounds, scrapes, corns, and calluses  Check your feet daily for blisters, cuts, bruises, sores, and redness. If you cannot see the bottom of your feet, use a mirror or ask someone for help.  Do not cut corns or calluses or try to remove them with medicine.  If you  find a minor scrape, cut, or break in the skin on your feet, keep it and the skin around it clean and dry. You may clean these areas with mild soap and water. Do not clean the area with peroxide, alcohol, or iodine.  If you have a wound, scrape, corn, or callus on your foot, look at it several times a day to make sure it is healing and not infected. Check for: ? Redness, swelling, or pain. ? Fluid or blood. ? Warmth. ? Pus or a bad smell. General instructions  Do not cross your legs. This may decrease blood flow to your feet.  Do not use heating pads or hot water bottles on your feet. They may burn your skin. If you have lost feeling in your feet or legs, you may not know this is happening until it is too late.  Protect your feet from hot and cold by wearing shoes, such as at the beach or on hot pavement.  Schedule a complete foot exam at least once a year (annually) or more often if you have foot problems. If you have foot problems, report any cuts, sores, or bruises to your health care provider immediately. Contact a health care provider if:  You have a medical condition that increases your risk of infection and you have any cuts, sores, or bruises on your feet.  You have an injury that is not   healing.  You have redness on your legs or feet.  You feel burning or tingling in your legs or feet.  You have pain or cramps in your legs and feet.  Your legs or feet are numb.  Your feet always feel cold.  You have pain around a toenail. Get help right away if:  You have a wound, scrape, corn, or callus on your foot and: ? You have pain, swelling, or redness that gets worse. ? You have fluid or blood coming from the wound, scrape, corn, or callus. ? Your wound, scrape, corn, or callus feels warm to the touch. ? You have pus or a bad smell coming from the wound, scrape, corn, or callus. ? You have a fever. ? You have a red line going up your leg. Summary  Check your feet every day  for cuts, sores, red spots, swelling, and blisters.  Moisturize feet and legs daily.  Wear shoes that fit properly and have enough cushioning.  If you have foot problems, report any cuts, sores, or bruises to your health care provider immediately.  Schedule a complete foot exam at least once a year (annually) or more often if you have foot problems. This information is not intended to replace advice given to you by your health care provider. Make sure you discuss any questions you have with your health care provider. Document Revised: 04/08/2019 Document Reviewed: 08/17/2016 Elsevier Patient Education  2020 [employer]  

## 2020-02-25 NOTE — Addendum Note (Signed)
Addended by: Bennie Pierini on: 02/25/2020 11:02 AM   Modules accepted: Orders

## 2020-02-26 LAB — CBC WITH DIFFERENTIAL/PLATELET
Basophils Absolute: 0.1 10*3/uL (ref 0.0–0.2)
Basos: 1 %
EOS (ABSOLUTE): 0.2 10*3/uL (ref 0.0–0.4)
Eos: 2 %
Hematocrit: 40.9 % (ref 37.5–51.0)
Hemoglobin: 13.5 g/dL (ref 13.0–17.7)
Immature Grans (Abs): 0 10*3/uL (ref 0.0–0.1)
Immature Granulocytes: 0 %
Lymphocytes Absolute: 3.3 10*3/uL — ABNORMAL HIGH (ref 0.7–3.1)
Lymphs: 34 %
MCH: 31.5 pg (ref 26.6–33.0)
MCHC: 33 g/dL (ref 31.5–35.7)
MCV: 95 fL (ref 79–97)
Monocytes Absolute: 0.9 10*3/uL (ref 0.1–0.9)
Monocytes: 9 %
Neutrophils Absolute: 5.4 10*3/uL (ref 1.4–7.0)
Neutrophils: 54 %
Platelets: 201 10*3/uL (ref 150–450)
RBC: 4.29 x10E6/uL (ref 4.14–5.80)
RDW: 13.3 % (ref 11.6–15.4)
WBC: 9.8 10*3/uL (ref 3.4–10.8)

## 2020-02-26 LAB — CMP14+EGFR
ALT: 35 IU/L (ref 0–44)
AST: 34 IU/L (ref 0–40)
Albumin/Globulin Ratio: 1.4 (ref 1.2–2.2)
Albumin: 4.2 g/dL (ref 3.8–4.8)
Alkaline Phosphatase: 70 IU/L (ref 48–121)
BUN/Creatinine Ratio: 10 (ref 10–24)
BUN: 13 mg/dL (ref 8–27)
Bilirubin Total: 0.7 mg/dL (ref 0.0–1.2)
CO2: 23 mmol/L (ref 20–29)
Calcium: 9.7 mg/dL (ref 8.6–10.2)
Chloride: 96 mmol/L (ref 96–106)
Creatinine, Ser: 1.29 mg/dL — ABNORMAL HIGH (ref 0.76–1.27)
GFR calc Af Amer: 68 mL/min/{1.73_m2} (ref >59)
GFR calc non Af Amer: 59 mL/min/{1.73_m2} — ABNORMAL LOW (ref >59)
Globulin, Total: 2.9 g/dL (ref 1.5–4.5)
Glucose: 198 mg/dL — ABNORMAL HIGH (ref 65–99)
Potassium: 4.8 mmol/L (ref 3.5–5.2)
Sodium: 138 mmol/L (ref 134–144)
Total Protein: 7.1 g/dL (ref 6.0–8.5)

## 2020-02-26 LAB — LIPID PANEL
Chol/HDL Ratio: 7.2 ratio — ABNORMAL HIGH (ref 0.0–5.0)
Cholesterol, Total: 238 mg/dL — ABNORMAL HIGH (ref 100–199)
HDL: 33 mg/dL — ABNORMAL LOW (ref >39)
LDL Chol Calc (NIH): 151 mg/dL — ABNORMAL HIGH (ref 0–99)
Triglycerides: 290 mg/dL — ABNORMAL HIGH (ref 0–149)
VLDL Cholesterol Cal: 54 mg/dL — ABNORMAL HIGH (ref 5–40)

## 2020-03-03 MED ORDER — ROSUVASTATIN CALCIUM 10 MG PO TABS
10.0000 mg | ORAL_TABLET | Freq: Every day | ORAL | 1 refills | Status: DC
Start: 2020-03-03 — End: 2020-05-31

## 2020-03-03 NOTE — Addendum Note (Signed)
Addended by: Bennie Pierini on: 03/03/2020 03:40 PM   Modules accepted: Orders

## 2020-05-27 ENCOUNTER — Ambulatory Visit (INDEPENDENT_AMBULATORY_CARE_PROVIDER_SITE_OTHER): Payer: Self-pay | Admitting: Pharmacist

## 2020-05-27 ENCOUNTER — Other Ambulatory Visit: Payer: Self-pay

## 2020-05-27 DIAGNOSIS — E119 Type 2 diabetes mellitus without complications: Secondary | ICD-10-CM

## 2020-05-27 MED ORDER — DAPAGLIFLOZIN PROPANEDIOL 5 MG PO TABS
5.0000 mg | ORAL_TABLET | Freq: Every day | ORAL | 0 refills | Status: DC
Start: 2020-05-27 — End: 2020-09-30

## 2020-05-27 MED ORDER — DAPAGLIFLOZIN PROPANEDIOL 10 MG PO TABS
10.0000 mg | ORAL_TABLET | Freq: Every day | ORAL | 0 refills | Status: DC
Start: 2020-05-27 — End: 2020-05-27

## 2020-05-27 NOTE — Progress Notes (Signed)
° ° °  05/27/2020 Name: Shawn Malone MRN: [id number] DOB: 1956-07-14  S:  89 yoM Presents for diabetes evaluation, education, and management Patient was referred and last seen by Primary Care Provider on 02/25/20.  Insurance coverage/medication affordability: medicare  Patient reports adherence with medications.  Current diabetes medications include: glyburide, pioglitazone, trulicity  Current hypertension medications include: metoprolol Goal 130/80  Current hyperlipidemia medications include: rosuvasatin   Patient denies hypoglycemic events.   Discussed meal planning options and Plate method for healthy eating  Avoid sugary drinks and desserts  Incorporate balanced protein, non starchy veggies, 1 serving of carbohydrate with each meal  Increase water intake  Increase physical activity as able The patient is asked to make an attempt to improve diet and exercise patterns to aid in medical management of this problem.  O:  Lab Results  Component Value Date   HGBA1C 7.2 (H) 02/25/2020    There were no vitals filed for this visit.     Lipid Panel     Component Value Date/Time   CHOL 238 (H) 02/25/2020 1023   TRIG 290 (H) 02/25/2020 1023   HDL 33 (L) 02/25/2020 1023   CHOLHDL 7.2 (H) 02/25/2020 1023   LDLCALC 151 (H) 02/25/2020 1023    A/P:  Diabetes T2DM currently uncontrolled, most recent A1c 7.8%. Patient is adherent with medication.   -Start farxiga 5 daily then increase to 10mg  daily when shipment from patient assistance arrives  -[place] application filled out for medication assistance for Farxiga  -Discontinue glyburide  -Continue Trulicity 0.75mg --will not increase this medication due to current GI issues  -Cont pioglitazone for now, however we hope to discontinue this medication and continue on trulicity and farxiga  -Recommend ACEi.  No allergy/intolerance documented  -Extensively discussed pathophysiology of diabetes, recommended lifestyle  interventions, dietary effects on blood sugar control  -Counseled on s/sx of and management of hypoglycemia  Written patient instructions provided.  Total time in face to face counseling 28 minutes.   Follow up PCP Clinic Visit ON 05/31/20.    13/2/21, PharmD, BCPS Clinical Pharmacist, [facility]  II Phone [phone]

## 2020-05-31 ENCOUNTER — Ambulatory Visit (INDEPENDENT_AMBULATORY_CARE_PROVIDER_SITE_OTHER): Payer: Self-pay | Admitting: Nurse Practitioner

## 2020-05-31 ENCOUNTER — Other Ambulatory Visit: Payer: Self-pay

## 2020-05-31 ENCOUNTER — Encounter: Payer: Self-pay | Admitting: Nurse Practitioner

## 2020-05-31 VITALS — BP 135/84 | HR 126 | Temp 96.0°F | Resp 20 | Ht 68.0 in | Wt 222.0 lb

## 2020-05-31 DIAGNOSIS — Z6833 Body mass index (BMI) 33.0-33.9, adult: Secondary | ICD-10-CM

## 2020-05-31 DIAGNOSIS — G629 Polyneuropathy, unspecified: Secondary | ICD-10-CM

## 2020-05-31 DIAGNOSIS — I10 Essential (primary) hypertension: Secondary | ICD-10-CM

## 2020-05-31 DIAGNOSIS — I152 Hypertension secondary to endocrine disorders: Secondary | ICD-10-CM

## 2020-05-31 DIAGNOSIS — E1159 Type 2 diabetes mellitus with other circulatory complications: Secondary | ICD-10-CM

## 2020-05-31 DIAGNOSIS — F5101 Primary insomnia: Secondary | ICD-10-CM

## 2020-05-31 DIAGNOSIS — E1169 Type 2 diabetes mellitus with other specified complication: Secondary | ICD-10-CM

## 2020-05-31 DIAGNOSIS — R000 Tachycardia, unspecified: Secondary | ICD-10-CM

## 2020-05-31 DIAGNOSIS — E785 Hyperlipidemia, unspecified: Secondary | ICD-10-CM

## 2020-05-31 DIAGNOSIS — E119 Type 2 diabetes mellitus without complications: Secondary | ICD-10-CM

## 2020-05-31 LAB — BAYER DCA HB A1C WAIVED: HB A1C (BAYER DCA - WAIVED): 7.8 % — ABNORMAL HIGH (ref <7.0)

## 2020-05-31 MED ORDER — SPIRONOLACTONE 25 MG PO TABS: 25.0000 mg | ORAL_TABLET | Freq: Every day | ORAL | 1 refills | Status: DC

## 2020-05-31 MED ORDER — ROSUVASTATIN CALCIUM 10 MG PO TABS: 10.0000 mg | ORAL_TABLET | Freq: Every day | ORAL | 1 refills | Status: DC

## 2020-05-31 MED ORDER — PIOGLITAZONE HCL 45 MG PO TABS: 45.0000 mg | ORAL_TABLET | Freq: Every day | ORAL | 1 refills | Status: DC

## 2020-05-31 MED ORDER — GABAPENTIN 300 MG PO CAPS: 600.0000 mg | ORAL_CAPSULE | Freq: Three times a day (TID) | ORAL | 3 refills | Status: DC

## 2020-05-31 MED ORDER — TRULICITY 0.75 MG/0.5ML ~~LOC~~ SOAJ: 0.7500 mg | SUBCUTANEOUS | 1 refills | Status: DC

## 2020-05-31 MED ORDER — METOPROLOL SUCCINATE ER 50 MG PO TB24: 50.0000 mg | ORAL_TABLET | Freq: Every day | ORAL | 1 refills | Status: DC

## 2020-05-31 MED ORDER — GEMFIBROZIL 600 MG PO TABS: 600.0000 mg | ORAL_TABLET | Freq: Two times a day (BID) | ORAL | 5 refills | Status: DC

## 2020-05-31 MED ORDER — AMITRIPTYLINE HCL 150 MG PO TABS: 150.0000 mg | ORAL_TABLET | Freq: Every day | ORAL | 1 refills | Status: DC

## 2020-05-31 NOTE — Patient Instructions (Signed)

## 2020-05-31 NOTE — Addendum Note (Signed)
Addended by: Bennie Pierini on: 05/31/2020 12:45 PM   Modules accepted: Orders

## 2020-05-31 NOTE — Addendum Note (Signed)
Addended by: Bennie Pierini on: 05/31/2020 01:42 PM   Modules accepted: Level of Service

## 2020-05-31 NOTE — Progress Notes (Signed)
Subjective:    Patient ID: Shawn Malone, male    DOB: Sep 01, 1955, 64 y.o.   MRN: [id number]   Chief Complaint: Medical Management of Chronic Issues    HPI:  1. Type 2 diabetes mellitus without complication, without long-term current use of insulin ([place]) Checks blood sugars at home sometimes. Usually around 150. Denies blurry vision but does have peripheral neuropathy. Does not really watch carb intake. Clinical pharmacist stopped his glimipirdie and started him on farxiga. Lab Results  Component Value Date   HGBA1C 7.2 (H) 02/25/2020     2. Hypertension associated with diabetes ([place]) Does not check BP at home. Denies chest pain, SOB, headaches. Does try to watch sodium intake. BP Readings from Last 3 Encounters:  05/31/20 135/84  02/25/20 109/74  11/26/19 113/77    3. Hyperlipidemia associated with type 2 diabetes mellitus ([place]) Takes crestor at night. Does try to watch intake of fatty and fried foods. Lab Results  Component Value Date   CHOL 238 (H) 02/25/2020   HDL 33 (L) 02/25/2020   LDLCALC 151 (H) 02/25/2020   TRIG 290 (H) 02/25/2020   CHOLHDL 7.2 (H) 02/25/2020    4. Primary insomnia Is sleeping okay but says he does wake up at night sometimes.  5. Neuropathy Has burning and tingling in his feet and is starting to have some burning and tingling in his hands also. Takes gabapentin which does help but does still have symptoms.  .6  BMI 33.0-33.9 No recent weight changes Wt Readings from Last 3 Encounters:  05/31/20 222 lb (100.7 kg)  02/25/20 (!) 219 lb (99.3 kg)  11/26/19 216 lb 2 oz (98 kg)   BMI Readings from Last 3 Encounters:  05/31/20 33.75 kg/m  02/25/20 33.30 kg/m  11/26/19 32.86 kg/m     Outpatient Encounter Medications as of 05/31/2020  Medication Sig  . amitriptyline (ELAVIL) 150 MG tablet Take 1 tablet (150 mg total) by mouth at bedtime.  Marland Kitchen aspirin 325 MG EC tablet Take 325 mg by mouth daily.  . dapagliflozin propanediol (FARXIGA) 5 MG  TABS tablet Take 1 tablet (5 mg total) by mouth daily before breakfast.  . Dulaglutide (TRULICITY) 2.59 DG/3.8VF SOPN Inject 0.5 mLs (0.75 mg total) into the skin once a week.  . gabapentin (NEURONTIN) 300 MG capsule Take 2-3 capsules (600-900 mg total) by mouth 3 (three) times daily.  Marland Kitchen gemfibrozil (LOPID) 600 MG tablet Take 1 tablet (600 mg total) by mouth 2 (two) times daily before a meal.  . metoprolol succinate (TOPROL-XL) 50 MG 24 hr tablet Take 1 tablet (50 mg total) by mouth daily. Take with or immediately following a meal.  . pioglitazone (ACTOS) 45 MG tablet Take 1 tablet (45 mg total) by mouth daily.  . rosuvastatin (CRESTOR) 10 MG tablet Take 1 tablet (10 mg total) by mouth daily.  . sildenafil (REVATIO) 20 MG tablet Take 1-2 tablets (20-40 mg total) by mouth 3 (three) times daily.  Marland Kitchen spironolactone (ALDACTONE) 25 MG tablet Take 1 tablet (25 mg total) by mouth daily.   No facility-administered encounter medications on file as of 05/31/2020.    Past Surgical History:  Procedure Laterality Date  . None     As of 08/10/16    Family History  Problem Relation Age of Onset  . Cancer Mother   . Colon cancer Brother 71  . Lung cancer Brother   . Liver disease Neg Hx     New complaints: None  Social history: Lives with girlfriend.  Controlled substance contract: N/A   Review of Systems  Constitutional: Negative.   HENT: Negative.   Respiratory: Negative.   Cardiovascular: Negative.   Gastrointestinal: Negative.   Musculoskeletal: Negative.   Skin: Negative.   Neurological:       Burning and tingling in feet and hands  Psychiatric/Behavioral: Negative.        Objective:   Physical Exam Vitals and nursing note reviewed.  Constitutional:      Appearance: Normal appearance.  HENT:     Head: Normocephalic.     Right Ear: Tympanic membrane normal.     Left Ear: Tympanic membrane normal.     Nose: Nose normal.     Mouth/Throat:     Mouth: Mucous membranes are  moist.     Pharynx: Oropharynx is clear.  Eyes:     Conjunctiva/sclera: Conjunctivae normal.  Cardiovascular:     Rate and Rhythm: Normal rate and regular rhythm.     Pulses: Normal pulses.     Heart sounds: Normal heart sounds.  Pulmonary:     Effort: Pulmonary effort is normal.  Abdominal:     General: Bowel sounds are normal.     Palpations: Abdomen is soft.  Musculoskeletal:        General: Normal range of motion.     Cervical back: Normal range of motion.  Skin:    General: Skin is warm and dry.  Neurological:     General: No focal deficit present.     Mental Status: He is alert and oriented to person, place, and time.  Psychiatric:        Mood and Affect: Mood normal.        Behavior: Behavior normal.     BP 135/84   Pulse (!) 126   Temp (!) 96 F (35.6 C) (Temporal)   Resp 20   Ht $R'5\' 8"'Dl$  (1.727 m)   Wt 222 lb (100.7 kg)   SpO2 97%   BMI 33.75 kg/m        Assessment & Plan:  Firas Guardado comes in today with chief complaint of Medical Management of Chronic Issues   Diagnosis and orders addressed:  1. Type 2 diabetes mellitus without complication, without long-term current use of insulin ([facility]) Strict carb counting Just recently started on farxiga and that sheould work well to bring blood sugars down - Bayer DCA Hb A1c Waived - pioglitazone (ACTOS) 45 MG tablet; Take 1 tablet (45 mg total) by mouth daily.  Dispense: 90 tablet; Refill: 1 - Dulaglutide (TRULICITY) 6.94 WN/4.6EV SOPN; Inject 0.75 mg into the skin once a week.  Dispense: 6 mL; Refill: 1  2. Hypertension associated with diabetes ([place]) Low sodium diet - CBC with Differential/Platelet - CMP14+EGFR - spironolactone (ALDACTONE) 25 MG tablet; Take 1 tablet (25 mg total) by mouth daily.  Dispense: 90 tablet; Refill: 1 - metoprolol succinate (TOPROL-XL) 50 MG 24 hr tablet; Take 1 tablet (50 mg total) by mouth daily. Take with or immediately following a meal.  Dispense: 90 tablet; Refill: 1 -  metoprolol succinate (TOPROL-XL) 50 MG 24 hr tablet; Take 1 tablet (50 mg total) by mouth daily. Take with or immediately following a meal.  Dispense: 90 tablet; Refill: 1   3. Hyperlipidemia associated with type 2 diabetes mellitus ([facility]) Low fat diet - Lipid panel - rosuvastatin (CRESTOR) 10 MG tablet; Take 1 tablet (10 mg total) by mouth daily.  Dispense: 90 tablet; Refill: 1 - gemfibrozil (LOPID) 600 MG tablet; Take 1 tablet (600  mg total) by mouth 2 (two) times daily before a meal.  Dispense: 60 tablet; Refill: 5  4. Primary insomnia Bedtime routine  5. Neuropathy - gabapentin (NEURONTIN) 300 MG capsule; Take 2-3 capsules (600-900 mg total) by mouth 3 (three) times daily.  Dispense: 240 capsule; Refill: 3 - amitriptyline (ELAVIL) 150 MG tablet; Take 1 tablet (150 mg total) by mouth at bedtime.  Dispense: 90 tablet; Refill: 1  6. BMI 33.0-33.9,adult Discussed diet and exercise for person with BMI >25 Will recheck weight in 3-6 months   Labs pending Health Maintenance reviewed Diet and exercise encouraged  Follow up plan: 3 months   Mary-Margaret Hassell Done, FNP

## 2020-06-01 LAB — CBC WITH DIFFERENTIAL/PLATELET
Basophils Absolute: 0.1 10*3/uL (ref 0.0–0.2)
Basos: 1 %
EOS (ABSOLUTE): 0.2 10*3/uL (ref 0.0–0.4)
Eos: 2 %
Hematocrit: 45.7 % (ref 37.5–51.0)
Hemoglobin: 15.4 g/dL (ref 13.0–17.7)
Immature Grans (Abs): 0.1 10*3/uL (ref 0.0–0.1)
Immature Granulocytes: 1 %
Lymphocytes Absolute: 2.7 10*3/uL (ref 0.7–3.1)
Lymphs: 28 %
MCH: 30.8 pg (ref 26.6–33.0)
MCHC: 33.7 g/dL (ref 31.5–35.7)
MCV: 91 fL (ref 79–97)
Monocytes Absolute: 0.7 10*3/uL (ref 0.1–0.9)
Monocytes: 7 %
Neutrophils Absolute: 6 10*3/uL (ref 1.4–7.0)
Neutrophils: 61 %
Platelets: 203 10*3/uL (ref 150–450)
RBC: 5 x10E6/uL (ref 4.14–5.80)
RDW: 12.1 % (ref 11.6–15.4)
WBC: 9.6 10*3/uL (ref 3.4–10.8)

## 2020-06-01 LAB — CMP14+EGFR
ALT: 41 IU/L (ref 0–44)
AST: 32 IU/L (ref 0–40)
Albumin/Globulin Ratio: 1.5 (ref 1.2–2.2)
Albumin: 4.3 g/dL (ref 3.8–4.8)
Alkaline Phosphatase: 86 IU/L (ref 44–121)
BUN/Creatinine Ratio: 13 (ref 10–24)
BUN: 15 mg/dL (ref 8–27)
Bilirubin Total: 0.6 mg/dL (ref 0.0–1.2)
CO2: 26 mmol/L (ref 20–29)
Calcium: 9.7 mg/dL (ref 8.6–10.2)
Chloride: 93 mmol/L — ABNORMAL LOW (ref 96–106)
Creatinine, Ser: 1.2 mg/dL (ref 0.76–1.27)
GFR calc Af Amer: 74 mL/min/{1.73_m2} (ref >59)
GFR calc non Af Amer: 64 mL/min/{1.73_m2} (ref >59)
Globulin, Total: 2.8 g/dL (ref 1.5–4.5)
Glucose: 297 mg/dL — ABNORMAL HIGH (ref 65–99)
Potassium: 4.2 mmol/L (ref 3.5–5.2)
Sodium: 137 mmol/L (ref 134–144)
Total Protein: 7.1 g/dL (ref 6.0–8.5)

## 2020-06-01 LAB — LIPID PANEL
Chol/HDL Ratio: 5.2 ratio — ABNORMAL HIGH (ref 0.0–5.0)
Cholesterol, Total: 173 mg/dL (ref 100–199)
HDL: 33 mg/dL — ABNORMAL LOW (ref >39)
LDL Chol Calc (NIH): 73 mg/dL (ref 0–99)
Triglycerides: 424 mg/dL — ABNORMAL HIGH (ref 0–149)
VLDL Cholesterol Cal: 67 mg/dL — ABNORMAL HIGH (ref 5–40)

## 2020-06-06 ENCOUNTER — Telehealth: Payer: Self-pay | Admitting: Pharmacist

## 2020-06-06 NOTE — Telephone Encounter (Signed)
Patient approved until 05/30/21 since he is uninsured Will not need to reapply until next year Farxiga to ship to patient's home in 7-10 business days

## 2020-09-02 ENCOUNTER — Encounter: Payer: Self-pay | Admitting: Nurse Practitioner

## 2020-09-02 ENCOUNTER — Other Ambulatory Visit: Payer: Self-pay

## 2020-09-02 ENCOUNTER — Ambulatory Visit (INDEPENDENT_AMBULATORY_CARE_PROVIDER_SITE_OTHER): Payer: Self-pay | Admitting: Nurse Practitioner

## 2020-09-02 VITALS — BP 104/68 | HR 95 | Temp 96.9°F | Resp 20 | Ht 68.0 in | Wt 213.0 lb

## 2020-09-02 DIAGNOSIS — E1169 Type 2 diabetes mellitus with other specified complication: Secondary | ICD-10-CM

## 2020-09-02 DIAGNOSIS — Z6833 Body mass index (BMI) 33.0-33.9, adult: Secondary | ICD-10-CM

## 2020-09-02 DIAGNOSIS — E785 Hyperlipidemia, unspecified: Secondary | ICD-10-CM

## 2020-09-02 DIAGNOSIS — E119 Type 2 diabetes mellitus without complications: Secondary | ICD-10-CM

## 2020-09-02 DIAGNOSIS — Z125 Encounter for screening for malignant neoplasm of prostate: Secondary | ICD-10-CM

## 2020-09-02 DIAGNOSIS — I152 Hypertension secondary to endocrine disorders: Secondary | ICD-10-CM

## 2020-09-02 DIAGNOSIS — E1159 Type 2 diabetes mellitus with other circulatory complications: Secondary | ICD-10-CM

## 2020-09-02 DIAGNOSIS — G629 Polyneuropathy, unspecified: Secondary | ICD-10-CM

## 2020-09-02 DIAGNOSIS — F5101 Primary insomnia: Secondary | ICD-10-CM

## 2020-09-02 LAB — BAYER DCA HB A1C WAIVED: HB A1C (BAYER DCA - WAIVED): 8.7 % — ABNORMAL HIGH (ref <7.0)

## 2020-09-02 MED ORDER — TRULICITY 1.5 MG/0.5ML ~~LOC~~ SOAJ: 1.5000 mg | SUBCUTANEOUS | 2 refills | Status: DC

## 2020-09-02 NOTE — Patient Instructions (Signed)
Diabetes Mellitus and Foot Care Foot care is an important part of your health, especially when you have diabetes. Diabetes may cause you to have problems because of poor blood flow (circulation) to your feet and legs, which can cause your skin to:  Become thinner and drier.  Break more easily.  Heal more slowly.  Peel and crack. You may also have nerve damage (neuropathy) in your legs and feet, causing decreased feeling in them. This means that you may not notice minor injuries to your feet that could lead to more serious problems. Noticing and addressing any potential problems early is the best way to prevent future foot problems. How to care for your feet Foot hygiene  Wash your feet daily with warm water and mild soap. Do not use hot water. Then, pat your feet and the areas between your toes until they are completely dry. Do not soak your feet as this can dry your skin.  Trim your toenails straight across. Do not dig under them or around the cuticle. File the edges of your nails with an emery board or nail file.  Apply a moisturizing lotion or petroleum jelly to the skin on your feet and to dry, brittle toenails. Use lotion that does not contain alcohol and is unscented. Do not apply lotion between your toes.   Shoes and socks  Wear clean socks or stockings every day. Make sure they are not too tight. Do not wear knee-high stockings since they may decrease blood flow to your legs.  Wear shoes that fit properly and have enough cushioning. Always look in your shoes before you put them on to be sure there are no objects inside.  To break in new shoes, wear them for just a few hours a day. This prevents injuries on your feet. Wounds, scrapes, corns, and calluses  Check your feet daily for blisters, cuts, bruises, sores, and redness. If you cannot see the bottom of your feet, use a mirror or ask someone for help.  Do not cut corns or calluses or try to remove them with medicine.  If you  find a minor scrape, cut, or break in the skin on your feet, keep it and the skin around it clean and dry. You may clean these areas with mild soap and water. Do not clean the area with peroxide, alcohol, or iodine.  If you have a wound, scrape, corn, or callus on your foot, look at it several times a day to make sure it is healing and not infected. Check for: ? Redness, swelling, or pain. ? Fluid or blood. ? Warmth. ? Pus or a bad smell.   General tips  Do not cross your legs. This may decrease blood flow to your feet.  Do not use heating pads or hot water bottles on your feet. They may burn your skin. If you have lost feeling in your feet or legs, you may not know this is happening until it is too late.  Protect your feet from hot and cold by wearing shoes, such as at the beach or on hot pavement.  Schedule a complete foot exam at least once a year (annually) or more often if you have foot problems. Report any cuts, sores, or bruises to your health care provider immediately. Where to find more information  American Diabetes Association: www.diabetes.org  Association of Diabetes Care & Education Specialists: www.diabeteseducator.org Contact a health care provider if:  You have a medical condition that increases your risk of infection and   you have any cuts, sores, or bruises on your feet.  You have an injury that is not healing.  You have redness on your legs or feet.  You feel burning or tingling in your legs or feet.  You have pain or cramps in your legs and feet.  Your legs or feet are numb.  Your feet always feel cold.  You have pain around any toenails. Get help right away if:  You have a wound, scrape, corn, or callus on your foot and: ? You have pain, swelling, or redness that gets worse. ? You have fluid or blood coming from the wound, scrape, corn, or callus. ? Your wound, scrape, corn, or callus feels warm to the touch. ? You have pus or a bad smell coming from  the wound, scrape, corn, or callus. ? You have a fever. ? You have a red line going up your leg. Summary  Check your feet every day for blisters, cuts, bruises, sores, and redness.  Apply a moisturizing lotion or petroleum jelly to the skin on your feet and to dry, brittle toenails.  Wear shoes that fit properly and have enough cushioning.  If you have foot problems, report any cuts, sores, or bruises to your health care provider immediately.  Schedule a complete foot exam at least once a year (annually) or more often if you have foot problems. This information is not intended to replace advice given to you by your health care provider. Make sure you discuss any questions you have with your health care provider. Document Revised: 02/04/2020 Document Reviewed: 02/04/2020 Elsevier Patient Education  2021 Elsevier Inc.  

## 2020-09-02 NOTE — Progress Notes (Addendum)
Subjective:    Patient ID: Shawn Malone, male    DOB: [birth year], 65 y.o.   MRN: [id number]   Chief Complaint: Medical management of chronic conditions   HPI:  1. Hypertension associated with diabetes ([place]) Compliant with medications; denies CP, SOB; confirms positional dizziness denies syncope  BP Readings from Last 3 Encounters:  09/02/20 104/68  05/31/20 135/84  02/25/20 109/74     2. Hyperlipidemia associated with type 2 diabetes mellitus ([place]) Cooks at home when he can, but does eat take out often.   Lab Results  Component Value Date   CHOL 173 05/31/2020   CHOL 238 (H) 02/25/2020   CHOL 231 (H) 08/24/2019   Lab Results  Component Value Date   HDL 33 (L) 05/31/2020   HDL 33 (L) 02/25/2020   HDL 32 (L) 08/24/2019   Lab Results  Component Value Date   LDLCALC 73 05/31/2020   LDLCALC 151 (H) 02/25/2020   LDLCALC 88 08/24/2019   Lab Results  Component Value Date   TRIG 424 (H) 05/31/2020   TRIG 290 (H) 02/25/2020   TRIG 672 (HH) 08/24/2019   Lab Results  Component Value Date   CHOLHDL 5.2 (H) 05/31/2020   CHOLHDL 7.2 (H) 02/25/2020   CHOLHDL 7.2 (H) 08/24/2019   No results found for: LDLDIRECT   3. Type 2 diabetes mellitus without complication, without long-term current use of insulin ([facility]) Confirms some episodes of low BG sx. Feels anxious, shaky, usually happens when he does not eat for several hours. Will begin to bring snacks with him to eat when feeling his blood sugar drop. Could be contributing to his dizziness as well.   [place] 4. Neuropathy Confirms hands have began to "burn and tingle" for the last 3 months. Has gotten worse in the last few months. Hand pain rated at 4/10. Continues to have numbness/tingling in LE.Cannot feel hot or cold. SO applies lotion and checks his feet daily.   5. Primary insomnia Sleeps around 8 hours every night. Taking amitriptyline with no complications. Will sleep though the night most nights. Denies nocturia.    6. BMI 33.0-33.9,adult Encouraged to eat balanced diet. Reduce your carbohydrate/fat intake. Does not get as much exercise in winter, but will begin yard work and and walking when weather gets warmer.   Wt Readings from Last 3 Encounters:  09/02/20 213 lb (96.6 kg)  05/31/20 222 lb (100.7 kg)  02/25/20 (!) 219 lb (99.3 kg)      Outpatient Encounter Medications as of 09/02/2020  Medication Sig  . amitriptyline (ELAVIL) 150 MG tablet Take 1 tablet (150 mg total) by mouth at bedtime.  Marland Kitchen aspirin 325 MG EC tablet Take 325 mg by mouth daily.  . dapagliflozin propanediol (FARXIGA) 5 MG TABS tablet Take 1 tablet (5 mg total) by mouth daily before breakfast. (Patient taking differently: Take 10 mg by mouth daily before breakfast. )  . Dulaglutide (TRULICITY) 0.26 VZ/8.5YI SOPN Inject 0.75 mg into the skin once a week.  . gabapentin (NEURONTIN) 300 MG capsule Take 2-3 capsules (600-900 mg total) by mouth 3 (three) times daily.  Marland Kitchen gemfibrozil (LOPID) 600 MG tablet Take 1 tablet (600 mg total) by mouth 2 (two) times daily before a meal.  . metoprolol succinate (TOPROL-XL) 50 MG 24 hr tablet Take 1 tablet (50 mg total) by mouth daily. Take with or immediately following a meal.  . pioglitazone (ACTOS) 45 MG tablet Take 1 tablet (45 mg total) by mouth daily.  . rosuvastatin (CRESTOR)  10 MG tablet Take 1 tablet (10 mg total) by mouth daily.  . sildenafil (REVATIO) 20 MG tablet Take 1-2 tablets (20-40 mg total) by mouth 3 (three) times daily.  Marland Kitchen spironolactone (ALDACTONE) 25 MG tablet Take 1 tablet (25 mg total) by mouth daily.   No facility-administered encounter medications on file as of 09/02/2020.    Past Surgical History:  Procedure Laterality Date  . None     As of 08/10/16    Family History  Problem Relation Age of Onset  . Cancer Mother   . Colon cancer Brother 54  . Lung cancer Brother   . Liver disease Neg Hx     New complaints: None  Social history: Lives with girlfriend;  retired  Controlled substance contract: N/A     Review of Systems  Constitutional: Negative for fatigue and fever.  HENT: Positive for tinnitus.   Respiratory: Negative for apnea, cough, chest tightness and shortness of breath.   Cardiovascular: Negative for chest pain, palpitations and leg swelling.  Gastrointestinal: Negative for abdominal distention, abdominal pain, constipation, diarrhea and nausea.  Genitourinary: Negative for dysuria and frequency.  Musculoskeletal: Negative for arthralgias and joint swelling.  Neurological: Positive for dizziness and numbness.       Objective:   Physical Exam Vitals reviewed.  Constitutional:      Appearance: Normal appearance.  HENT:     Head: Normocephalic.     Right Ear: Tympanic membrane normal.     Left Ear: Tympanic membrane normal.  Eyes:     Extraocular Movements: Extraocular movements intact.  Neck:     Vascular: No carotid bruit.  Cardiovascular:     Rate and Rhythm: Normal rate.     Pulses: Normal pulses.     Heart sounds: Normal heart sounds.  Pulmonary:     Effort: Pulmonary effort is normal.     Breath sounds: Normal breath sounds.  Abdominal:     General: Bowel sounds are normal.     Palpations: Abdomen is soft.  Musculoskeletal:        General: No swelling or tenderness.     Cervical back: Normal range of motion.     Right lower leg: No edema.     Left lower leg: No edema.  Skin:    General: Skin is warm and dry.  Neurological:     Mental Status: He is alert and oriented to person, place, and time.     Cranial Nerves: No cranial nerve deficit.  Psychiatric:        Mood and Affect: Mood normal.        Behavior: Behavior normal.        Thought Content: Thought content normal.        Judgment: Judgment normal.     BP 104/68   Pulse 95   Temp (!) 96.9 F (36.1 C) (Temporal)   Resp 20   Ht $R'5\' 8"'qL$  (1.727 m)   Wt 213 lb (96.6 kg)   SpO2 96%   BMI 32.39 kg/m   HgA1C 8.7       Assessment & Plan:    Shawn Malone comes in today with chief complaint of Medical Management of Chronic Issues   Diagnosis and orders addressed:  1. Hypertension associated with diabetes ([place]) Low fat low sodium diet; increase exercise to 30 min daily  - CBC with Differential/Platelet - CMP14+EGFR  2. Hyperlipidemia associated with type 2 diabetes mellitus ([facility]) Low fat diet; increase intake of vegetables and whole wheat -  Lipid panel  3. Type 2 diabetes mellitus without complication, without long-term current use of insulin ([facility])  Increase Trulicity to 1.$RemoveBefore'5mg'RnLnegNOgReAv$  weekly Keep daily blood sugar diary Take diabetic medications as prescribed Decrease carbohydrate intake, low fat/low carbohydrate diet Follow up in one month for repeat A1C  - Bayer DCA Hb A1c Waived - Dulaglutide (TRULICITY) 1.5 VO/3.6GO SOPN; Inject 1.5 mg into the skin once a week.  Dispense: 2 mL; Refill: 2  4. Neuropathy Continue to monitor lower extremity skin daily, daily foot care Do not go barefooted  5. Primary insomnia Can increase Amitriptyline to $RemoveBeforeDE'300mg'yzKaDEAMQOrdCpy$  qhs if needed for insomnia. Strive for 8hrs of sleep every night   6. BMI 33.0-33.9,adult Low fat/low carbohydrate diet encouraged. Encouraged to decrease intake of high fat/high sodium foods like sausage biscuits. Increase exercise to 30 min of movement per day.   7. Screening for prostate cancer Labs pending - PSA, total and free   Labs pending Health Maintenance reviewed Diet and exercise encouraged  Follow up plan: Follow up in one month for repeat A1C   Dollene Primrose, RN, BSN, FNP-Student  Mary-Margaret Hassell Done, FNP

## 2020-09-03 ENCOUNTER — Other Ambulatory Visit: Payer: Self-pay | Admitting: Nurse Practitioner

## 2020-09-03 DIAGNOSIS — E119 Type 2 diabetes mellitus without complications: Secondary | ICD-10-CM

## 2020-09-03 LAB — CMP14+EGFR
ALT: 35 IU/L (ref 0–44)
AST: 28 IU/L (ref 0–40)
Albumin/Globulin Ratio: 1.6 (ref 1.2–2.2)
Albumin: 4.2 g/dL (ref 3.8–4.8)
Alkaline Phosphatase: 90 IU/L (ref 44–121)
BUN/Creatinine Ratio: 10 (ref 10–24)
BUN: 13 mg/dL (ref 8–27)
Bilirubin Total: 1 mg/dL (ref 0.0–1.2)
CO2: 20 mmol/L (ref 20–29)
Calcium: 9.5 mg/dL (ref 8.6–10.2)
Chloride: 97 mmol/L (ref 96–106)
Creatinine, Ser: 1.26 mg/dL (ref 0.76–1.27)
GFR calc Af Amer: 70 mL/min/{1.73_m2} (ref >59)
GFR calc non Af Amer: 61 mL/min/{1.73_m2} (ref >59)
Globulin, Total: 2.6 g/dL (ref 1.5–4.5)
Glucose: 230 mg/dL — ABNORMAL HIGH (ref 65–99)
Potassium: 4.9 mmol/L (ref 3.5–5.2)
Sodium: 134 mmol/L (ref 134–144)
Total Protein: 6.8 g/dL (ref 6.0–8.5)

## 2020-09-03 LAB — CBC WITH DIFFERENTIAL/PLATELET
Basophils Absolute: 0.1 10*3/uL (ref 0.0–0.2)
Basos: 1 %
EOS (ABSOLUTE): 0.2 10*3/uL (ref 0.0–0.4)
Eos: 2 %
Hematocrit: 46.7 % (ref 37.5–51.0)
Hemoglobin: 15.5 g/dL (ref 13.0–17.7)
Immature Grans (Abs): 0 10*3/uL (ref 0.0–0.1)
Immature Granulocytes: 0 %
Lymphocytes Absolute: 2.8 10*3/uL (ref 0.7–3.1)
Lymphs: 28 %
MCH: 30.6 pg (ref 26.6–33.0)
MCHC: 33.2 g/dL (ref 31.5–35.7)
MCV: 92 fL (ref 79–97)
Monocytes Absolute: 0.8 10*3/uL (ref 0.1–0.9)
Monocytes: 8 %
Neutrophils Absolute: 6.3 10*3/uL (ref 1.4–7.0)
Neutrophils: 61 %
Platelets: 223 10*3/uL (ref 150–450)
RBC: 5.07 x10E6/uL (ref 4.14–5.80)
RDW: 12.3 % (ref 11.6–15.4)
WBC: 10.3 10*3/uL (ref 3.4–10.8)

## 2020-09-03 LAB — PSA, TOTAL AND FREE
PSA, Free Pct: 19.2 %
PSA, Free: 0.25 ng/mL
Prostate Specific Ag, Serum: 1.3 ng/mL (ref 0.0–4.0)

## 2020-09-03 LAB — LIPID PANEL
Chol/HDL Ratio: 4.6 ratio (ref 0.0–5.0)
Cholesterol, Total: 138 mg/dL (ref 100–199)
HDL: 30 mg/dL — ABNORMAL LOW (ref >39)
LDL Chol Calc (NIH): 64 mg/dL (ref 0–99)
Triglycerides: 271 mg/dL — ABNORMAL HIGH (ref 0–149)
VLDL Cholesterol Cal: 44 mg/dL — ABNORMAL HIGH (ref 5–40)

## 2020-09-30 ENCOUNTER — Ambulatory Visit: Payer: Self-pay | Admitting: Nurse Practitioner

## 2020-09-30 ENCOUNTER — Ambulatory Visit (INDEPENDENT_AMBULATORY_CARE_PROVIDER_SITE_OTHER): Payer: Self-pay | Admitting: Nurse Practitioner

## 2020-09-30 ENCOUNTER — Other Ambulatory Visit: Payer: Self-pay

## 2020-09-30 ENCOUNTER — Encounter: Payer: Self-pay | Admitting: Nurse Practitioner

## 2020-09-30 VITALS — BP 110/72 | HR 104 | Temp 97.2°F | Resp 20 | Ht 68.0 in | Wt 213.0 lb

## 2020-09-30 DIAGNOSIS — E119 Type 2 diabetes mellitus without complications: Secondary | ICD-10-CM

## 2020-09-30 LAB — BAYER DCA HB A1C WAIVED: HB A1C (BAYER DCA - WAIVED): 8.5 % — ABNORMAL HIGH (ref <7.0)

## 2020-09-30 MED ORDER — DAPAGLIFLOZIN PROPANEDIOL 10 MG PO TABS: 10.0000 mg | ORAL_TABLET | Freq: Every day | ORAL | 3 refills | Status: DC

## 2020-09-30 NOTE — Patient Instructions (Signed)

## 2020-09-30 NOTE — Progress Notes (Signed)
   Subjective:    Patient ID: Shawn Malone, male    DOB: [birth year], 65 y.o.   MRN: [id number]   Chief Complaint: Diabetes   HPI Patient was seen on 09/02/20 for follow up of chronic medical problems. He had not been watch ing diet very closely and his hgba1c went up to 8.7%. we increased his trulicity to 1.5mg  weekly along with actos and farxiga. Blood sugars have been running 160-170 each morning.    Review of Systems  Constitutional: Negative for diaphoresis.  Eyes: Negative for pain.  Respiratory: Negative for shortness of breath.   Cardiovascular: Negative for chest pain, palpitations and leg swelling.  Gastrointestinal: Negative for abdominal pain.  Endocrine: Negative for polydipsia.  Skin: Negative for rash.  Neurological: Negative for dizziness, weakness and headaches.  Hematological: Does not bruise/bleed easily.  All other systems reviewed and are negative.      Objective:   Physical Exam Vitals reviewed.  Constitutional:      Appearance: Normal appearance.  Cardiovascular:     Rate and Rhythm: Normal rate and regular rhythm.     Heart sounds: Normal heart sounds.  Pulmonary:     Breath sounds: Normal breath sounds.  Skin:    General: Skin is warm.  Neurological:     General: No focal deficit present.     Mental Status: He is alert and oriented to person, place, and time.  Psychiatric:        Mood and Affect: Mood normal.        Behavior: Behavior normal.     BP 110/72   Pulse (!) 104   Temp (!) 97.2 F (36.2 C) (Temporal)   Resp 20   Ht 5\' 8"  (1.727 m)   Wt 213 lb (96.6 kg)   SpO2 93%   BMI 32.39 kg/m        Assessment & Plan:  Jabron Weese comes in today with chief complaint of Diabetes   Diagnosis and orders addressed:  1. Type 2 diabetes mellitus without complication, without long-term current use of insulin ([facility]) Increased farxiga to 10mg  daily - Bayer DCA Hb A1c Waived - dapagliflozin propanediol (FARXIGA) 10 MG TABS tablet; Take 1  tablet (10 mg total) by mouth daily before breakfast.  Dispense: 30 tablet; Refill: 3   Labs pending Health Maintenance reviewed Diet and exercise encouraged  Follow up plan: 3 months   Mary-Margaret Beatris Si, FNP

## 2020-11-01 ENCOUNTER — Telehealth: Payer: Self-pay

## 2020-11-01 DIAGNOSIS — E119 Type 2 diabetes mellitus without complications: Secondary | ICD-10-CM

## 2020-11-01 NOTE — Telephone Encounter (Signed)
Called and spoke with Stanton Kidney to clarify and make sure patient is taking twice weekly. Stanton Kidney states yes. Per [facility] health department sent over form for [place] to fill out . We find form and send to health dept

## 2020-11-01 NOTE — Telephone Encounter (Signed)
Please send new prescription to health department

## 2020-11-03 ENCOUNTER — Other Ambulatory Visit: Payer: Self-pay | Admitting: Nurse Practitioner

## 2020-11-03 DIAGNOSIS — E1169 Type 2 diabetes mellitus with other specified complication: Secondary | ICD-10-CM

## 2020-11-03 DIAGNOSIS — E785 Hyperlipidemia, unspecified: Secondary | ICD-10-CM

## 2020-11-10 ENCOUNTER — Telehealth: Payer: Self-pay

## 2020-11-10 NOTE — Telephone Encounter (Signed)
Left message on VM letting pt know the samples are ready.

## 2020-11-22 ENCOUNTER — Other Ambulatory Visit: Payer: Self-pay | Admitting: Nurse Practitioner

## 2020-11-22 DIAGNOSIS — G629 Polyneuropathy, unspecified: Secondary | ICD-10-CM

## 2020-12-02 ENCOUNTER — Other Ambulatory Visit: Payer: Self-pay

## 2020-12-02 ENCOUNTER — Ambulatory Visit (INDEPENDENT_AMBULATORY_CARE_PROVIDER_SITE_OTHER): Payer: Medicaid Other | Admitting: Nurse Practitioner

## 2020-12-02 ENCOUNTER — Encounter: Payer: Self-pay | Admitting: Nurse Practitioner

## 2020-12-02 VITALS — BP 103/68 | HR 101 | Temp 96.8°F | Resp 20 | Ht 68.0 in | Wt 205.0 lb

## 2020-12-02 DIAGNOSIS — F5101 Primary insomnia: Secondary | ICD-10-CM

## 2020-12-02 DIAGNOSIS — G629 Polyneuropathy, unspecified: Secondary | ICD-10-CM | POA: Diagnosis not present

## 2020-12-02 DIAGNOSIS — E1159 Type 2 diabetes mellitus with other circulatory complications: Secondary | ICD-10-CM

## 2020-12-02 DIAGNOSIS — N521 Erectile dysfunction due to diseases classified elsewhere: Secondary | ICD-10-CM

## 2020-12-02 DIAGNOSIS — E119 Type 2 diabetes mellitus without complications: Secondary | ICD-10-CM

## 2020-12-02 DIAGNOSIS — E1169 Type 2 diabetes mellitus with other specified complication: Secondary | ICD-10-CM

## 2020-12-02 DIAGNOSIS — E785 Hyperlipidemia, unspecified: Secondary | ICD-10-CM

## 2020-12-02 DIAGNOSIS — I152 Hypertension secondary to endocrine disorders: Secondary | ICD-10-CM

## 2020-12-02 DIAGNOSIS — Z6833 Body mass index (BMI) 33.0-33.9, adult: Secondary | ICD-10-CM

## 2020-12-02 LAB — BAYER DCA HB A1C WAIVED: HB A1C (BAYER DCA - WAIVED): 6.8 % (ref <7.0)

## 2020-12-02 MED ORDER — SPIRONOLACTONE 25 MG PO TABS: 25.0000 mg | ORAL_TABLET | Freq: Every day | ORAL | 1 refills | Status: DC

## 2020-12-02 MED ORDER — AMITRIPTYLINE HCL 150 MG PO TABS
150.0000 mg | ORAL_TABLET | Freq: Every day | ORAL | 1 refills | Status: DC
Start: 2020-12-02 — End: 2021-03-08

## 2020-12-02 MED ORDER — GEMFIBROZIL 600 MG PO TABS: 600.0000 mg | ORAL_TABLET | Freq: Two times a day (BID) | ORAL | 1 refills | Status: DC

## 2020-12-02 MED ORDER — GABAPENTIN 300 MG PO CAPS: 600.0000 mg | ORAL_CAPSULE | Freq: Three times a day (TID) | ORAL | 3 refills | Status: DC

## 2020-12-02 MED ORDER — ROSUVASTATIN CALCIUM 10 MG PO TABS: 10.0000 mg | ORAL_TABLET | Freq: Every day | ORAL | 1 refills | Status: DC

## 2020-12-02 MED ORDER — METOPROLOL SUCCINATE ER 50 MG PO TB24: 50.0000 mg | ORAL_TABLET | Freq: Every day | ORAL | 1 refills | Status: DC

## 2020-12-02 MED ORDER — PIOGLITAZONE HCL 45 MG PO TABS
45.0000 mg | ORAL_TABLET | Freq: Every day | ORAL | 1 refills | Status: DC
Start: 2020-12-02 — End: 2021-06-12

## 2020-12-02 MED ORDER — SILDENAFIL CITRATE 20 MG PO TABS: 20.0000 mg | ORAL_TABLET | Freq: Three times a day (TID) | ORAL | 5 refills | Status: DC

## 2020-12-02 NOTE — Patient Instructions (Signed)
Diabetes Mellitus and Foot Care Foot care is an important part of your health, especially when you have diabetes. Diabetes may cause you to have problems because of poor blood flow (circulation) to your feet and legs, which can cause your skin to:  Become thinner and drier.  Break more easily.  Heal more slowly.  Peel and crack. You may also have nerve damage (neuropathy) in your legs and feet, causing decreased feeling in them. This means that you may not notice minor injuries to your feet that could lead to more serious problems. Noticing and addressing any potential problems early is the best way to prevent future foot problems. How to care for your feet Foot hygiene  Wash your feet daily with warm water and mild soap. Do not use hot water. Then, pat your feet and the areas between your toes until they are completely dry. Do not soak your feet as this can dry your skin.  Trim your toenails straight across. Do not dig under them or around the cuticle. File the edges of your nails with an emery board or nail file.  Apply a moisturizing lotion or petroleum jelly to the skin on your feet and to dry, brittle toenails. Use lotion that does not contain alcohol and is unscented. Do not apply lotion between your toes.   Shoes and socks  Wear clean socks or stockings every day. Make sure they are not too tight. Do not wear knee-high stockings since they may decrease blood flow to your legs.  Wear shoes that fit properly and have enough cushioning. Always look in your shoes before you put them on to be sure there are no objects inside.  To break in new shoes, wear them for just a few hours a day. This prevents injuries on your feet. Wounds, scrapes, corns, and calluses  Check your feet daily for blisters, cuts, bruises, sores, and redness. If you cannot see the bottom of your feet, use a mirror or ask someone for help.  Do not cut corns or calluses or try to remove them with medicine.  If you  find a minor scrape, cut, or break in the skin on your feet, keep it and the skin around it clean and dry. You may clean these areas with mild soap and water. Do not clean the area with peroxide, alcohol, or iodine.  If you have a wound, scrape, corn, or callus on your foot, look at it several times a day to make sure it is healing and not infected. Check for: ? Redness, swelling, or pain. ? Fluid or blood. ? Warmth. ? Pus or a bad smell.   General tips  Do not cross your legs. This may decrease blood flow to your feet.  Do not use heating pads or hot water bottles on your feet. They may burn your skin. If you have lost feeling in your feet or legs, you may not know this is happening until it is too late.  Protect your feet from hot and cold by wearing shoes, such as at the beach or on hot pavement.  Schedule a complete foot exam at least once a year (annually) or more often if you have foot problems. Report any cuts, sores, or bruises to your health care provider immediately. Where to find more information  American Diabetes Association: www.diabetes.org  Association of Diabetes Care & Education Specialists: www.diabeteseducator.org Contact a health care provider if:  You have a medical condition that increases your risk of infection and   you have any cuts, sores, or bruises on your feet.  You have an injury that is not healing.  You have redness on your legs or feet.  You feel burning or tingling in your legs or feet.  You have pain or cramps in your legs and feet.  Your legs or feet are numb.  Your feet always feel cold.  You have pain around any toenails. Get help right away if:  You have a wound, scrape, corn, or callus on your foot and: ? You have pain, swelling, or redness that gets worse. ? You have fluid or blood coming from the wound, scrape, corn, or callus. ? Your wound, scrape, corn, or callus feels warm to the touch. ? You have pus or a bad smell coming from  the wound, scrape, corn, or callus. ? You have a fever. ? You have a red line going up your leg. Summary  Check your feet every day for blisters, cuts, bruises, sores, and redness.  Apply a moisturizing lotion or petroleum jelly to the skin on your feet and to dry, brittle toenails.  Wear shoes that fit properly and have enough cushioning.  If you have foot problems, report any cuts, sores, or bruises to your health care provider immediately.  Schedule a complete foot exam at least once a year (annually) or more often if you have foot problems. This information is not intended to replace advice given to you by your health care provider. Make sure you discuss any questions you have with your health care provider. Document Revised: 02/04/2020 Document Reviewed: 02/04/2020 Elsevier Patient Education  2021 Elsevier Inc.  

## 2020-12-02 NOTE — Progress Notes (Signed)
Subjective:    Patient ID: Shawn Malone, male    DOB: [birth year], 65 y.o.   MRN: [id number]   Chief Complaint: Medical Management of Chronic Issues    HPI:  1. Type 2 diabetes mellitus without complication, without long-term current use of insulin (HCC) He has not been chcking his blood sugars very often. He thinks they are around 150 when he has checked them. We increased his farxiga at last visit. Lab Results  Component Value Date   HGBA1C 8.5 (H) 09/30/2020     2. Hypertension associated with diabetes ([place]) No c/o chest pain, sob or headache. Does not check blood pressure at home. BP Readings from Last 3 Encounters:  12/02/20 103/68  09/30/20 110/72  09/02/20 104/68     3. Hyperlipidemia associated with type 2 diabetes mellitus ([place]) Not watching diet and does no dedicated exercise. Lab Results  Component Value Date   CHOL 138 09/02/2020   HDL 30 (L) 09/02/2020   LDLCALC 64 09/02/2020   TRIG 271 (H) 09/02/2020   CHOLHDL 4.6 09/02/2020     4. Neuropathy He has burning , numbness and tingling in bil feet  5. Primary insomnia Sleeping well. Says he sleeps about 7 hours a night  6. BMI 33.0-33.9,adult Weight is down 8 lbs Wt Readings from Last 3 Encounters:  12/02/20 205 lb (93 kg)  09/30/20 213 lb (96.6 kg)  09/02/20 213 lb (96.6 kg)   BMI Readings from Last 3 Encounters:  12/02/20 31.17 kg/m  09/30/20 32.39 kg/m  09/02/20 32.39 kg/m       Outpatient Encounter Medications as of 12/02/2020  Medication Sig  . amitriptyline (ELAVIL) 150 MG tablet TAKE 1 TABLET BY MOUTH AT BEDTIME.  Marland Kitchen aspirin 325 MG EC tablet Take 325 mg by mouth daily.  . dapagliflozin propanediol (FARXIGA) 10 MG TABS tablet Take 1 tablet (10 mg total) by mouth daily before breakfast.  . Dulaglutide (TRULICITY) 1.5 XB/2.6OM SOPN Inject 1.5 mg into the skin once a week.  . gabapentin (NEURONTIN) 300 MG capsule Take 2-3 capsules (600-900 mg total) by mouth 3 (three) times daily.  Marland Kitchen  gemfibrozil (LOPID) 600 MG tablet Take 1 tablet (600 mg total) by mouth 2 (two) times daily before a meal.  . metoprolol succinate (TOPROL-XL) 50 MG 24 hr tablet Take 1 tablet (50 mg total) by mouth daily. Take with or immediately following a meal.  . pioglitazone (ACTOS) 45 MG tablet Take 1 tablet by mouth once daily  . rosuvastatin (CRESTOR) 10 MG tablet Take 1 tablet by mouth once daily  . sildenafil (REVATIO) 20 MG tablet Take 1-2 tablets (20-40 mg total) by mouth 3 (three) times daily.  Marland Kitchen spironolactone (ALDACTONE) 25 MG tablet Take 1 tablet (25 mg total) by mouth daily.   No facility-administered encounter medications on file as of 12/02/2020.    Past Surgical History:  Procedure Laterality Date  . None     As of 08/10/16    Family History  Problem Relation Age of Onset  . Cancer Mother   . Colon cancer Brother 12  . Lung cancer Brother   . Liver disease Neg Hx     New complaints: None today  Social history: Lives with his girlfriend  Controlled substance contract: n/a    Review of Systems  Constitutional: Negative for diaphoresis.  Eyes: Negative for pain.  Respiratory: Negative for shortness of breath.   Cardiovascular: Negative for chest pain, palpitations and leg swelling.  Gastrointestinal: Negative for abdominal pain.  Endocrine: Negative for polydipsia.  Skin: Negative for rash.  Neurological: Negative for dizziness, weakness and headaches.  Hematological: Does not bruise/bleed easily.  All other systems reviewed and are negative.      Objective:   Physical Exam Vitals and nursing note reviewed.  Constitutional:      Appearance: Normal appearance. He is well-developed.  HENT:     Head: Normocephalic.     Nose: Nose normal.  Eyes:     Pupils: Pupils are equal, round, and reactive to light.  Neck:     Thyroid: No thyroid mass or thyromegaly.     Vascular: No carotid bruit or JVD.     Trachea: Phonation normal.  Cardiovascular:     Rate and  Rhythm: Normal rate and regular rhythm.  Pulmonary:     Effort: Pulmonary effort is normal. No respiratory distress.     Breath sounds: Normal breath sounds.  Abdominal:     General: Bowel sounds are normal.     Palpations: Abdomen is soft.     Tenderness: There is no abdominal tenderness.  Musculoskeletal:        General: Normal range of motion.     Cervical back: Normal range of motion and neck supple.  Lymphadenopathy:     Cervical: No cervical adenopathy.  Skin:    General: Skin is warm and dry.  Neurological:     Mental Status: He is alert and oriented to person, place, and time.  Psychiatric:        Behavior: Behavior normal.        Thought Content: Thought content normal.        Judgment: Judgment normal.     BP 103/68   Pulse (!) 101   Temp (!) 96.8 F (36 C) (Temporal)   Resp 20   Ht $R'5\' 8"'hR$  (1.727 m)   Wt 205 lb (93 kg)   SpO2 96%   BMI 31.17 kg/m   HGBA1c 6.8%      Assessment & Plan:  Shawn Malone comes in today with chief complaint of Medical Management of Chronic Issues   Diagnosis and orders addressed:  1. Type 2 diabetes mellitus without complication, without long-term current use of insulin (HCC) Need  to check blood sugars daily - Bayer DCA Hb A1c Waived - pioglitazone (ACTOS) 45 MG tablet; Take 1 tablet (45 mg total) by mouth daily.  Dispense: 90 tablet; Refill: 1  2. Hypertension associated with diabetes ([place]) Low sodium diet - CBC with Differential/Platelet - CMP14+EGFR - spironolactone (ALDACTONE) 25 MG tablet; Take 1 tablet (25 mg total) by mouth daily.  Dispense: 90 tablet; Refill: 1 - metoprolol succinate (TOPROL-XL) 50 MG 24 hr tablet; Take 1 tablet (50 mg total) by mouth daily. Take with or immediately following a meal.  Dispense: 90 tablet; Refill: 1  - metoprolol succinate (TOPROL-XL) 50 MG 24 hr tablet; Take 1 tablet (50 mg total) by mouth daily. Take with or immediately following a meal.  Dispense: 90 tablet; Refill: 1  3.  Hyperlipidemia associated with type 2 diabetes mellitus (HCC) Low fat diet - Lipid panel - rosuvastatin (CRESTOR) 10 MG tablet; Take 1 tablet (10 mg total) by mouth daily.  Dispense: 90 tablet; Refill: 1 - gemfibrozil (LOPID) 600 MG tablet; Take 1 tablet (600 mg total) by mouth 2 (two) times daily before a meal.  Dispense: 180 tablet; Refill: 1  4. Neuropathy Do not go barefooted - amitriptyline (ELAVIL) 150 MG tablet; Take 1 tablet (150 mg total) by mouth  at bedtime.  Dispense: 90 tablet; Refill: 1 - gabapentin (NEURONTIN) 300 MG capsule; Take 2-3 capsules (600-900 mg total) by mouth 3 (three) times daily.  Dispense: 240 capsule; Refill: 3  5. Primary insomnia Bedtime routine  6. BMI 33.0-33.9,adult Discussed diet and exercise for person with BMI >25 Will recheck weight in 3-6 months  7. Erectile dysfunction associated with type 2 diabetes mellitus (HCC) - sildenafil (REVATIO) 20 MG tablet; Take 1-2 tablets (20-40 mg total) by mouth 3 (three) times daily.  Dispense: 20 tablet; Refill: 5   Labs pending Health Maintenance reviewed Diet and exercise encouraged  Follow up plan: 3 months   Mary-Margaret Hassell Done, FNP

## 2020-12-03 LAB — CBC WITH DIFFERENTIAL/PLATELET
Basophils Absolute: 0.1 10*3/uL (ref 0.0–0.2)
Basos: 1 %
EOS (ABSOLUTE): 0.1 10*3/uL (ref 0.0–0.4)
Eos: 2 %
Hematocrit: 45.5 % (ref 37.5–51.0)
Hemoglobin: 14.8 g/dL (ref 13.0–17.7)
Immature Grans (Abs): 0 10*3/uL (ref 0.0–0.1)
Immature Granulocytes: 0 %
Lymphocytes Absolute: 2.7 10*3/uL (ref 0.7–3.1)
Lymphs: 30 %
MCH: 29.9 pg (ref 26.6–33.0)
MCHC: 32.5 g/dL (ref 31.5–35.7)
MCV: 92 fL (ref 79–97)
Monocytes Absolute: 0.6 10*3/uL (ref 0.1–0.9)
Monocytes: 7 %
Neutrophils Absolute: 5.4 10*3/uL (ref 1.4–7.0)
Neutrophils: 60 %
Platelets: 222 10*3/uL (ref 150–450)
RBC: 4.95 x10E6/uL (ref 4.14–5.80)
RDW: 12.9 % (ref 11.6–15.4)
WBC: 9 10*3/uL (ref 3.4–10.8)

## 2020-12-03 LAB — CMP14+EGFR
ALT: 30 IU/L (ref 0–44)
AST: 24 IU/L (ref 0–40)
Albumin/Globulin Ratio: 1.7 (ref 1.2–2.2)
Albumin: 4.3 g/dL (ref 3.8–4.8)
Alkaline Phosphatase: 79 IU/L (ref 44–121)
BUN/Creatinine Ratio: 14 (ref 10–24)
BUN: 17 mg/dL (ref 8–27)
Bilirubin Total: 0.8 mg/dL (ref 0.0–1.2)
CO2: 21 mmol/L (ref 20–29)
Calcium: 9.3 mg/dL (ref 8.6–10.2)
Chloride: 99 mmol/L (ref 96–106)
Creatinine, Ser: 1.18 mg/dL (ref 0.76–1.27)
Globulin, Total: 2.5 g/dL (ref 1.5–4.5)
Glucose: 210 mg/dL — ABNORMAL HIGH (ref 65–99)
Potassium: 4.5 mmol/L (ref 3.5–5.2)
Sodium: 136 mmol/L (ref 134–144)
Total Protein: 6.8 g/dL (ref 6.0–8.5)
eGFR: 69 mL/min/{1.73_m2} (ref >59)

## 2020-12-03 LAB — LIPID PANEL
Chol/HDL Ratio: 4 ratio (ref 0.0–5.0)
Cholesterol, Total: 133 mg/dL (ref 100–199)
HDL: 33 mg/dL — ABNORMAL LOW (ref >39)
LDL Chol Calc (NIH): 62 mg/dL (ref 0–99)
Triglycerides: 232 mg/dL — ABNORMAL HIGH (ref 0–149)
VLDL Cholesterol Cal: 38 mg/dL (ref 5–40)

## 2020-12-05 ENCOUNTER — Telehealth: Payer: Self-pay

## 2020-12-05 NOTE — Telephone Encounter (Signed)
Wife made aware that pt should be taking crestor and lopid for cholesterol.

## 2020-12-06 ENCOUNTER — Other Ambulatory Visit: Payer: Self-pay

## 2020-12-06 DIAGNOSIS — E119 Type 2 diabetes mellitus without complications: Secondary | ICD-10-CM

## 2020-12-06 MED ORDER — TRULICITY 1.5 MG/0.5ML ~~LOC~~ SOAJ: 1.5000 mg | SUBCUTANEOUS | 2 refills | Status: DC

## 2020-12-06 NOTE — Telephone Encounter (Signed)
Pt needs updated prescription printed and faxed to health department for Trulicity due to increase in dose. Printed and faxed. Pts girlfriend notified

## 2020-12-07 ENCOUNTER — Telehealth: Payer: Self-pay

## 2020-12-07 DIAGNOSIS — E119 Type 2 diabetes mellitus without complications: Secondary | ICD-10-CM

## 2020-12-07 MED ORDER — TRULICITY 1.5 MG/0.5ML ~~LOC~~ SOAJ: 1.5000 mg | SUBCUTANEOUS | 2 refills | Status: DC

## 2020-12-07 NOTE — Telephone Encounter (Signed)
Needed [facility] he will not be going back to the [facility], resent yesterdays refill that got printed

## 2020-12-10 ENCOUNTER — Other Ambulatory Visit: Payer: Self-pay | Admitting: Nurse Practitioner

## 2020-12-10 DIAGNOSIS — E1159 Type 2 diabetes mellitus with other circulatory complications: Secondary | ICD-10-CM

## 2020-12-10 DIAGNOSIS — I152 Hypertension secondary to endocrine disorders: Secondary | ICD-10-CM

## 2020-12-20 ENCOUNTER — Telehealth: Payer: Self-pay

## 2020-12-20 NOTE — Telephone Encounter (Signed)
PA for Trulicity sent to Plan. Awaiting response   Beatris Si (Key: [id number]) [phone] Need help? Call us at [phone] Status Sent to [employer] The plan will fax you a determination, typically within 1 to 5 business days.  How do I follow up? Drug Trulicity 1.5MG /0.5ML pen-injectors Form [facility] Standard Drug Request Form Use for [facility] General Prior Authorization Request [phone] Original Claim Info

## 2020-12-22 NOTE — Telephone Encounter (Signed)
Prior Authorization approved for Trulicity from 12/20/20 through 12/20/21. Pharmacy notified

## 2021-01-10 ENCOUNTER — Telehealth: Payer: Self-pay | Admitting: Pharmacist

## 2021-01-10 DIAGNOSIS — E119 Type 2 diabetes mellitus without complications: Secondary | ICD-10-CM

## 2021-01-10 MED ORDER — DAPAGLIFLOZIN PROPANEDIOL 10 MG PO TABS: 10.0000 mg | ORAL_TABLET | Freq: Every day | ORAL | 3 refills | Status: DC

## 2021-01-10 NOTE — Telephone Encounter (Signed)
Patient now [place]

## 2021-01-27 ENCOUNTER — Other Ambulatory Visit: Payer: Self-pay | Admitting: Nurse Practitioner

## 2021-01-27 DIAGNOSIS — E785 Hyperlipidemia, unspecified: Secondary | ICD-10-CM

## 2021-02-17 ENCOUNTER — Telehealth: Payer: Self-pay | Admitting: *Deleted

## 2021-02-17 DIAGNOSIS — E119 Type 2 diabetes mellitus without complications: Secondary | ICD-10-CM

## 2021-02-17 NOTE — Telephone Encounter (Signed)
Key: [id number] - Rx #: [id number] Need help? Call us at [phone] Status Sent to Plan today Drug Farxiga 10MG  tablets

## 2021-02-20 ENCOUNTER — Other Ambulatory Visit: Payer: Self-pay | Admitting: Nurse Practitioner

## 2021-02-20 DIAGNOSIS — E1169 Type 2 diabetes mellitus with other specified complication: Secondary | ICD-10-CM

## 2021-02-20 NOTE — Telephone Encounter (Signed)
Approved today  PA Case: [id number], Status: Approved, Coverage Starts on: 02/20/2021 12:00:00 AM, Coverage Ends on: 02/20/2022 12:00:00 AM. [facility] AWARE

## 2021-02-28 ENCOUNTER — Telehealth: Payer: Self-pay | Admitting: Nurse Practitioner

## 2021-03-02 NOTE — Telephone Encounter (Signed)
PATIENT HAS MEDICAID CALL PLACED TO [place] PATIENT ASSISTANCE TO CANCEL PROGRAM

## 2021-03-08 ENCOUNTER — Ambulatory Visit: Payer: Medicaid Other | Admitting: Nurse Practitioner

## 2021-03-08 ENCOUNTER — Encounter: Payer: Self-pay | Admitting: Nurse Practitioner

## 2021-03-08 ENCOUNTER — Other Ambulatory Visit: Payer: Self-pay

## 2021-03-08 ENCOUNTER — Ambulatory Visit: Payer: Self-pay | Admitting: Nurse Practitioner

## 2021-03-08 VITALS — BP 114/74 | HR 94 | Temp 96.9°F | Resp 20 | Ht 68.0 in | Wt 206.0 lb

## 2021-03-08 DIAGNOSIS — G629 Polyneuropathy, unspecified: Secondary | ICD-10-CM | POA: Diagnosis not present

## 2021-03-08 DIAGNOSIS — E1169 Type 2 diabetes mellitus with other specified complication: Secondary | ICD-10-CM

## 2021-03-08 DIAGNOSIS — E785 Hyperlipidemia, unspecified: Secondary | ICD-10-CM | POA: Diagnosis not present

## 2021-03-08 DIAGNOSIS — Z6833 Body mass index (BMI) 33.0-33.9, adult: Secondary | ICD-10-CM

## 2021-03-08 DIAGNOSIS — E119 Type 2 diabetes mellitus without complications: Secondary | ICD-10-CM | POA: Diagnosis not present

## 2021-03-08 DIAGNOSIS — I152 Hypertension secondary to endocrine disorders: Secondary | ICD-10-CM

## 2021-03-08 DIAGNOSIS — F5101 Primary insomnia: Secondary | ICD-10-CM | POA: Diagnosis not present

## 2021-03-08 DIAGNOSIS — E1159 Type 2 diabetes mellitus with other circulatory complications: Secondary | ICD-10-CM | POA: Diagnosis not present

## 2021-03-08 LAB — BAYER DCA HB A1C WAIVED: HB A1C (BAYER DCA - WAIVED): 7.4 % — ABNORMAL HIGH (ref <7.0)

## 2021-03-08 MED ORDER — DAPAGLIFLOZIN PROPANEDIOL 10 MG PO TABS: 10.0000 mg | ORAL_TABLET | Freq: Every day | ORAL | 3 refills | Status: DC

## 2021-03-08 MED ORDER — METOPROLOL SUCCINATE ER 50 MG PO TB24: 50.0000 mg | ORAL_TABLET | Freq: Every day | ORAL | 1 refills | Status: DC

## 2021-03-08 MED ORDER — GABAPENTIN 300 MG PO CAPS: 600.0000 mg | ORAL_CAPSULE | Freq: Three times a day (TID) | ORAL | 3 refills | Status: DC

## 2021-03-08 MED ORDER — GEMFIBROZIL 600 MG PO TABS: 600.0000 mg | ORAL_TABLET | Freq: Two times a day (BID) | ORAL | 1 refills | Status: DC

## 2021-03-08 MED ORDER — AMITRIPTYLINE HCL 150 MG PO TABS: 150.0000 mg | ORAL_TABLET | Freq: Every day | ORAL | 1 refills | Status: DC

## 2021-03-08 MED ORDER — TRULICITY 1.5 MG/0.5ML ~~LOC~~ SOAJ: 1.5000 mg | SUBCUTANEOUS | 2 refills | Status: DC

## 2021-03-08 MED ORDER — ROSUVASTATIN CALCIUM 10 MG PO TABS: 10.0000 mg | ORAL_TABLET | Freq: Every day | ORAL | 1 refills | Status: DC

## 2021-03-08 MED ORDER — SPIRONOLACTONE 25 MG PO TABS: 25.0000 mg | ORAL_TABLET | Freq: Every day | ORAL | 1 refills | Status: DC

## 2021-03-08 NOTE — Progress Notes (Signed)
Subjective:    Patient ID: Shawn Malone, male    DOB: November 20, 1955, 65 y.o.   MRN: [id number]  Chief Complaint: Medical Management of Chronic Issues    HPI:  1. Type 2 diabetes mellitus without complication, without long-term current use of insulin ([place]) He does not check his blood sugars at home. He does not watch diet. Lab Results  Component Value Date   HGBA1C 6.8 12/02/2020     2. Hypertension associated with diabetes ([place]) No c/o chest pain, sob or headache. Does not check blood pressure at home. BP Readings from Last 3 Encounters:  03/08/21 114/74  12/02/20 103/68  09/30/20 110/72     3. Hyperlipidemia associated with type 2 diabetes mellitus ([place]) Doe snot watch diet and does no dedicated exercise. Lab Results  Component Value Date   CHOL 133 12/02/2020   HDL 33 (L) 12/02/2020   LDLCALC 62 12/02/2020   TRIG 232 (H) 12/02/2020   CHOLHDL 4.0 12/02/2020     4. Neuropathy Feet burn and hurt all the time. Is on neurontin and elavil which seems ot help  5. Primary insomnia Sleep 8-9 hours a night. Elavil helps him sleep at night.  6. BMI 33.0-33.9,adult No recent weight changes Wt Readings from Last 3 Encounters:  03/08/21 206 lb (93.4 kg)  12/02/20 205 lb (93 kg)  09/30/20 213 lb (96.6 kg)   BMI Readings from Last 3 Encounters:  03/08/21 31.32 kg/m  12/02/20 31.17 kg/m  09/30/20 32.39 kg/m       Outpatient Encounter Medications as of 03/08/2021  Medication Sig   amitriptyline (ELAVIL) 150 MG tablet Take 1 tablet (150 mg total) by mouth at bedtime.   aspirin 325 MG EC tablet Take 325 mg by mouth daily.   gabapentin (NEURONTIN) 300 MG capsule Take 2-3 capsules (600-900 mg total) by mouth 3 (three) times daily.   gemfibrozil (LOPID) 600 MG tablet Take 1 tablet (600 mg total) by mouth 2 (two) times daily before a meal.   metoprolol succinate (TOPROL-XL) 50 MG 24 hr tablet Take 1 tablet (50 mg total) by mouth daily. Take with or immediately following a  meal.   pioglitazone (ACTOS) 45 MG tablet Take 1 tablet (45 mg total) by mouth daily.   sildenafil (REVATIO) 20 MG tablet Take 1-2 tablets (20-40 mg total) by mouth 3 (three) times daily.   [DISCONTINUED] dapagliflozin propanediol (FARXIGA) 10 MG TABS tablet Take 1 tablet (10 mg total) by mouth daily before breakfast.   [DISCONTINUED] Dulaglutide (TRULICITY) 1.5 JJ/8.8CZ SOPN Inject 1.5 mg into the skin once a week.   [DISCONTINUED] rosuvastatin (CRESTOR) 10 MG tablet Take 1 tablet (10 mg total) by mouth daily.   [DISCONTINUED] spironolactone (ALDACTONE) 25 MG tablet Take 1 tablet (25 mg total) by mouth daily.   No facility-administered encounter medications on file as of 03/08/2021.    Past Surgical History:  Procedure Laterality Date   None     As of 08/10/16    Family History  Problem Relation Age of Onset   Cancer Mother    Colon cancer Brother 56   Lung cancer Brother    Liver disease Neg Hx     New complaints: None today  Social history: Lives with his girlfriend  Controlled substance contract: n/a     Review of Systems  Constitutional:  Negative for diaphoresis.  Eyes:  Negative for pain.  Respiratory:  Negative for shortness of breath.   Cardiovascular:  Negative for chest pain, palpitations and leg swelling.  Gastrointestinal:  Negative for abdominal pain.  Endocrine: Negative for polydipsia.  Skin:  Negative for rash.  Neurological:  Negative for dizziness, weakness and headaches.  Hematological:  Does not bruise/bleed easily.  All other systems reviewed and are negative.     Objective:   Physical Exam Vitals and nursing note reviewed.  Constitutional:      Appearance: Normal appearance. He is well-developed.  HENT:     Head: Normocephalic.     Nose: Nose normal.  Eyes:     Pupils: Pupils are equal, round, and reactive to light.  Neck:     Thyroid: No thyroid mass or thyromegaly.     Vascular: No carotid bruit or JVD.     Trachea: Phonation  normal.  Cardiovascular:     Rate and Rhythm: Normal rate and regular rhythm.  Pulmonary:     Effort: Pulmonary effort is normal. No respiratory distress.     Breath sounds: Normal breath sounds.  Abdominal:     General: Bowel sounds are normal.     Palpations: Abdomen is soft.     Tenderness: There is no abdominal tenderness.  Musculoskeletal:        General: Normal range of motion.     Cervical back: Normal range of motion and neck supple.  Lymphadenopathy:     Cervical: No cervical adenopathy.  Skin:    General: Skin is warm and dry.  Neurological:     Mental Status: He is alert and oriented to person, place, and time.  Psychiatric:        Behavior: Behavior normal.        Thought Content: Thought content normal.        Judgment: Judgment normal.    BP 114/74   Pulse 94   Temp (!) 96.9 F (36.1 C) (Temporal)   Resp 20   Ht $R'5\' 8"'mA$  (1.727 m)   Wt 206 lb (93.4 kg)   SpO2 100%   BMI 31.32 kg/m        Assessment & Plan:   Arion Shankles comes in today with chief complaint of Medical Management of Chronic Issues   Diagnosis and orders addressed:  1. Type 2 diabetes mellitus without complication, without long-term current use of insulin (HCC) Continue to watch carbs in diet - Bayer DCA Hb A1c Waived - Microalbumin / creatinine urine ratio - dapagliflozin propanediol (FARXIGA) 10 MG TABS tablet; Take 1 tablet (10 mg total) by mouth daily before breakfast.  Dispense: 90 tablet; Refill: 3 - Dulaglutide (TRULICITY) 1.5 GM/0.1UU SOPN; Inject 1.5 mg into the skin once a week.  Dispense: 2 mL; Refill: 2  2. Hypertension associated with diabetes ([place]) Low sodium diet - CBC with Differential/Platelet - CMP14+EGFR - spironolactone (ALDACTONE) 25 MG tablet; Take 1 tablet (25 mg total) by mouth daily.  Dispense: 90 tablet; Refill: 1 - metoprolol succinate (TOPROL-XL) 50 MG 24 hr tablet; Take 1 tablet (50 mg total) by mouth daily. Take with or immediately following a meal.   Dispense: 90 tablet; Refill: 1  3. Hyperlipidemia associated with type 2 diabetes mellitus (HCC) Low fat diet - Lipid panel - rosuvastatin (CRESTOR) 10 MG tablet; Take 1 tablet (10 mg total) by mouth daily.  Dispense: 90 tablet; Refill: 1 - gemfibrozil (LOPID) 600 MG tablet; Take 1 tablet (600 mg total) by mouth 2 (two) times daily before a meal.  Dispense: 180 tablet; Refill: 1  4. Neuropathy Do not go bare footed Continue to check feet daily - gabapentin (NEURONTIN) 300  MG capsule; Take 2-3 capsules (600-900 mg total) by mouth 3 (three) times daily.  Dispense: 240 capsule; Refill: 3 - amitriptyline (ELAVIL) 150 MG tablet; Take 1 tablet (150 mg total) by mouth at bedtime.  Dispense: 90 tablet; Refill: 1  5. Primary insomnia Bedtime routine  6. BMI 33.0-33.9,adult Discussed diet and exercise for person with BMI >25 Will recheck weight in 3-6 months    Labs pending Health Maintenance reviewed Diet and exercise encouraged  Follow up plan: 3 mnths   Mary-Margaret Hassell Done, FNP Continue to watch carbs in diet

## 2021-03-08 NOTE — Patient Instructions (Signed)

## 2021-03-09 LAB — CMP14+EGFR
ALT: 13 IU/L (ref 0–44)
AST: 14 IU/L (ref 0–40)
Albumin/Globulin Ratio: 1.6 (ref 1.2–2.2)
Albumin: 4.1 g/dL (ref 3.8–4.8)
Alkaline Phosphatase: 66 IU/L (ref 44–121)
BUN/Creatinine Ratio: 14 (ref 10–24)
BUN: 18 mg/dL (ref 8–27)
Bilirubin Total: 0.5 mg/dL (ref 0.0–1.2)
CO2: 25 mmol/L (ref 20–29)
Calcium: 10 mg/dL (ref 8.6–10.2)
Chloride: 98 mmol/L (ref 96–106)
Creatinine, Ser: 1.3 mg/dL — ABNORMAL HIGH (ref 0.76–1.27)
Globulin, Total: 2.5 g/dL (ref 1.5–4.5)
Glucose: 203 mg/dL — ABNORMAL HIGH (ref 65–99)
Potassium: 5.4 mmol/L — ABNORMAL HIGH (ref 3.5–5.2)
Sodium: 138 mmol/L (ref 134–144)
Total Protein: 6.6 g/dL (ref 6.0–8.5)
eGFR: 62 mL/min/{1.73_m2} (ref >59)

## 2021-03-09 LAB — CBC WITH DIFFERENTIAL/PLATELET
Basophils Absolute: 0.1 10*3/uL (ref 0.0–0.2)
Basos: 1 %
EOS (ABSOLUTE): 0.2 10*3/uL (ref 0.0–0.4)
Eos: 2 %
Hematocrit: 45.5 % (ref 37.5–51.0)
Hemoglobin: 15.1 g/dL (ref 13.0–17.7)
Immature Grans (Abs): 0 10*3/uL (ref 0.0–0.1)
Immature Granulocytes: 0 %
Lymphocytes Absolute: 2.5 10*3/uL (ref 0.7–3.1)
Lymphs: 32 %
MCH: 30.6 pg (ref 26.6–33.0)
MCHC: 33.2 g/dL (ref 31.5–35.7)
MCV: 92 fL (ref 79–97)
Monocytes Absolute: 0.5 10*3/uL (ref 0.1–0.9)
Monocytes: 7 %
Neutrophils Absolute: 4.4 10*3/uL (ref 1.4–7.0)
Neutrophils: 58 %
Platelets: 219 10*3/uL (ref 150–450)
RBC: 4.94 x10E6/uL (ref 4.14–5.80)
RDW: 12.8 % (ref 11.6–15.4)
WBC: 7.7 10*3/uL (ref 3.4–10.8)

## 2021-03-09 LAB — MICROALBUMIN / CREATININE URINE RATIO
Creatinine, Urine: 66.1 mg/dL
Microalb/Creat Ratio: <5 mg/g creat (ref 0–29)
Microalbumin, Urine: <3 ug/mL

## 2021-03-09 LAB — LIPID PANEL
Chol/HDL Ratio: 4.3 ratio (ref 0.0–5.0)
Cholesterol, Total: 150 mg/dL (ref 100–199)
HDL: 35 mg/dL — ABNORMAL LOW (ref >39)
LDL Chol Calc (NIH): 86 mg/dL (ref 0–99)
Triglycerides: 166 mg/dL — ABNORMAL HIGH (ref 0–149)
VLDL Cholesterol Cal: 29 mg/dL (ref 5–40)

## 2021-03-13 ENCOUNTER — Telehealth: Payer: Self-pay | Admitting: Nurse Practitioner

## 2021-03-13 NOTE — Telephone Encounter (Signed)
Patients questions answered per front office

## 2021-03-13 NOTE — Telephone Encounter (Signed)
Left message on patients voicemail to contact the office with questions

## 2021-04-17 ENCOUNTER — Encounter: Payer: Self-pay | Admitting: Nurse Practitioner

## 2021-04-17 ENCOUNTER — Ambulatory Visit: Payer: Medicaid Other | Admitting: Nurse Practitioner

## 2021-04-17 ENCOUNTER — Other Ambulatory Visit: Payer: Self-pay

## 2021-04-17 ENCOUNTER — Ambulatory Visit (INDEPENDENT_AMBULATORY_CARE_PROVIDER_SITE_OTHER): Payer: Medicaid Other

## 2021-04-17 VITALS — BP 118/70 | HR 88 | Temp 97.4°F | Resp 20 | Ht 68.0 in | Wt 206.0 lb

## 2021-04-17 DIAGNOSIS — Z01818 Encounter for other preprocedural examination: Secondary | ICD-10-CM | POA: Diagnosis not present

## 2021-04-17 NOTE — Progress Notes (Signed)
   Subjective:    Patient ID: Shawn Malone, male    DOB: Feb 19, 1956, 65 y.o.   MRN: [id number]  Chief Complaint: Surgical clearance   HPI Patient in today for surgical clearance. He is having some dental work and will be sedated and they wanted him medically cleared prior to scheduling. He is doing well with no complaints today.    Review of Systems  Constitutional:  Negative for diaphoresis.  Eyes:  Negative for pain.  Respiratory:  Negative for shortness of breath.   Cardiovascular:  Negative for chest pain, palpitations and leg swelling.  Gastrointestinal:  Negative for abdominal pain.  Endocrine: Negative for polydipsia.  Skin:  Negative for rash.  Neurological:  Negative for dizziness, weakness and headaches.  Hematological:  Does not bruise/bleed easily.  All other systems reviewed and are negative.     Objective:   Physical Exam Vitals and nursing note reviewed.  Constitutional:      Appearance: Normal appearance. He is well-developed.  HENT:     Head: Normocephalic.     Nose: Nose normal.  Eyes:     Pupils: Pupils are equal, round, and reactive to light.  Neck:     Thyroid: No thyroid mass or thyromegaly.     Vascular: No carotid bruit or JVD.     Trachea: Phonation normal.  Cardiovascular:     Rate and Rhythm: Normal rate and regular rhythm.  Pulmonary:     Effort: Pulmonary effort is normal. No respiratory distress.     Breath sounds: Normal breath sounds.  Abdominal:     General: Bowel sounds are normal.     Palpations: Abdomen is soft.     Tenderness: There is no abdominal tenderness.  Musculoskeletal:        General: Normal range of motion.     Cervical back: Normal range of motion and neck supple.  Lymphadenopathy:     Cervical: No cervical adenopathy.  Skin:    General: Skin is warm and dry.  Neurological:     Mental Status: He is alert and oriented to person, place, and time.  Psychiatric:        Behavior: Behavior normal.        Thought  Content: Thought content normal.        Judgment: Judgment normal.   BP 118/70   Pulse 88   Temp (!) 97.4 F (36.3 C) (Temporal)   Resp 20   Ht 5\' 8"  (1.727 m)   Wt 206 lb (93.4 kg)   SpO2 99%   BMI 31.32 kg/m   EKG- NSR with 1st degree av block-Mary-Margaret , FNP  Chest xray- no acute or chronic findings-Preliminary reading by Daphine Deutscher, FNP  [facility]       Assessment & Plan:  [facility] in today with chief complaint of Surgical clearance   1. Preoperative clearance Patient given surgical clearance- form faxed to office - EKG 12-Lead - DG Chest 2 View    The above assessment and management plan was discussed with the patient. The patient verbalized understanding of and has agreed to the management plan. Patient is aware to call the clinic if symptoms persist or worsen. Patient is aware when to return to the clinic for a follow-up visit. Patient educated on when it is appropriate to go to the emergency department.   Mary-Margaret Beatris Si, FNP

## 2021-06-12 ENCOUNTER — Ambulatory Visit: Payer: Self-pay | Admitting: Nurse Practitioner

## 2021-06-12 ENCOUNTER — Other Ambulatory Visit: Payer: Self-pay

## 2021-06-12 ENCOUNTER — Encounter: Payer: Self-pay | Admitting: Nurse Practitioner

## 2021-06-12 ENCOUNTER — Ambulatory Visit: Payer: Medicaid Other | Admitting: Nurse Practitioner

## 2021-06-12 VITALS — BP 110/72 | HR 83 | Temp 97.4°F | Resp 20 | Ht 68.0 in | Wt 205.0 lb

## 2021-06-12 DIAGNOSIS — E1169 Type 2 diabetes mellitus with other specified complication: Secondary | ICD-10-CM

## 2021-06-12 DIAGNOSIS — E1159 Type 2 diabetes mellitus with other circulatory complications: Secondary | ICD-10-CM | POA: Diagnosis not present

## 2021-06-12 DIAGNOSIS — E785 Hyperlipidemia, unspecified: Secondary | ICD-10-CM

## 2021-06-12 DIAGNOSIS — I152 Hypertension secondary to endocrine disorders: Secondary | ICD-10-CM | POA: Diagnosis not present

## 2021-06-12 DIAGNOSIS — F5101 Primary insomnia: Secondary | ICD-10-CM

## 2021-06-12 DIAGNOSIS — E119 Type 2 diabetes mellitus without complications: Secondary | ICD-10-CM

## 2021-06-12 DIAGNOSIS — Z6833 Body mass index (BMI) 33.0-33.9, adult: Secondary | ICD-10-CM | POA: Diagnosis not present

## 2021-06-12 DIAGNOSIS — G629 Polyneuropathy, unspecified: Secondary | ICD-10-CM

## 2021-06-12 LAB — BAYER DCA HB A1C WAIVED: HB A1C (BAYER DCA - WAIVED): 6.7 % — ABNORMAL HIGH (ref 4.8–5.6)

## 2021-06-12 MED ORDER — GEMFIBROZIL 600 MG PO TABS: 600.0000 mg | ORAL_TABLET | Freq: Two times a day (BID) | ORAL | 1 refills | Status: DC

## 2021-06-12 MED ORDER — AMITRIPTYLINE HCL 150 MG PO TABS: 150.0000 mg | ORAL_TABLET | Freq: Every day | ORAL | 1 refills | Status: DC

## 2021-06-12 MED ORDER — METOPROLOL SUCCINATE ER 50 MG PO TB24: 50.0000 mg | ORAL_TABLET | Freq: Every day | ORAL | 1 refills | Status: DC

## 2021-06-12 MED ORDER — DAPAGLIFLOZIN PROPANEDIOL 10 MG PO TABS: 10.0000 mg | ORAL_TABLET | Freq: Every day | ORAL | 3 refills | Status: DC

## 2021-06-12 MED ORDER — ROSUVASTATIN CALCIUM 10 MG PO TABS: 10.0000 mg | ORAL_TABLET | Freq: Every day | ORAL | 1 refills | Status: DC

## 2021-06-12 MED ORDER — GABAPENTIN 300 MG PO CAPS: 600.0000 mg | ORAL_CAPSULE | Freq: Three times a day (TID) | ORAL | 3 refills | Status: DC

## 2021-06-12 MED ORDER — SPIRONOLACTONE 25 MG PO TABS: 25.0000 mg | ORAL_TABLET | Freq: Every day | ORAL | 1 refills | Status: DC

## 2021-06-12 MED ORDER — PIOGLITAZONE HCL 45 MG PO TABS
45.0000 mg | ORAL_TABLET | Freq: Every day | ORAL | 1 refills | Status: DC
Start: 2021-06-12 — End: 2021-09-14

## 2021-06-12 MED ORDER — TRULICITY 1.5 MG/0.5ML ~~LOC~~ SOAJ: 1.5000 mg | SUBCUTANEOUS | 2 refills | Status: DC

## 2021-06-12 NOTE — Patient Instructions (Signed)

## 2021-06-12 NOTE — Progress Notes (Signed)
Subjective:    Patient ID: Shawn Malone, male    DOB: 1955-10-30, 65 y.o.   MRN: [id number]   Chief Complaint: Medical Management of Chronic Issues (Skin tags on neck/)    HPI:  1. Type 2 diabetes mellitus without complication, without long-term current use of insulin (HCC) Fasting blood sugars are running around 100-120. He des not check them every day. Lab Results  Component Value Date   HGBA1C 7.4 (H) 03/08/2021     2. Hypertension associated with diabetes ([place]) No c/o chest pain, sob or headache. Doe snot check blood pressure at home. BP Readings from Last 3 Encounters:  06/12/21 110/72  04/17/21 118/70  03/08/21 114/74     3. Hyperlipidemia associated with type 2 diabetes mellitus ([place]) Doe snot really watch diet and does no exercise. Lab Results  Component Value Date   CHOL 150 03/08/2021   HDL 35 (L) 03/08/2021   LDLCALC 86 03/08/2021   TRIG 166 (H) 03/08/2021   CHOLHDL 4.3 03/08/2021     4. Neuropathy Has numbness in bil feet. Is on neurontin which helps  5. Primary insomnia Sleeps about 8-9 hours a night  6. BMI 33.0-33.9,adult No recent weight changes Wt Readings from Last 3 Encounters:  06/12/21 205 lb (93 kg)  04/17/21 206 lb (93.4 kg)  03/08/21 206 lb (93.4 kg)   BMI Readings from Last 3 Encounters:  06/12/21 31.17 kg/m  04/17/21 31.32 kg/m  03/08/21 31.32 kg/m       Outpatient Encounter Medications as of 06/12/2021  Medication Sig   amitriptyline (ELAVIL) 150 MG tablet Take 1 tablet (150 mg total) by mouth at bedtime.   aspirin 325 MG EC tablet Take 325 mg by mouth daily.   dapagliflozin propanediol (FARXIGA) 10 MG TABS tablet Take 1 tablet (10 mg total) by mouth daily before breakfast.   Dulaglutide (TRULICITY) 1.5 TG/5.4DI SOPN Inject 1.5 mg into the skin once a week.   gabapentin (NEURONTIN) 300 MG capsule Take 2-3 capsules (600-900 mg total) by mouth 3 (three) times daily.   gemfibrozil (LOPID) 600 MG tablet Take 1 tablet (600  mg total) by mouth 2 (two) times daily before a meal.   metoprolol succinate (TOPROL-XL) 50 MG 24 hr tablet Take 1 tablet (50 mg total) by mouth daily. Take with or immediately following a meal.   pioglitazone (ACTOS) 45 MG tablet Take 1 tablet (45 mg total) by mouth daily.   rosuvastatin (CRESTOR) 10 MG tablet Take 1 tablet (10 mg total) by mouth daily.   sildenafil (REVATIO) 20 MG tablet Take 1-2 tablets (20-40 mg total) by mouth 3 (three) times daily.   spironolactone (ALDACTONE) 25 MG tablet Take 1 tablet (25 mg total) by mouth daily.   No facility-administered encounter medications on file as of 06/12/2021.    Past Surgical History:  Procedure Laterality Date   None     As of 08/10/16    Family History  Problem Relation Age of Onset   Cancer Mother    Colon cancer Brother 45   Lung cancer Brother    Liver disease Neg Hx     New complaints: None today  Social history: Lives with his girlfriend  Controlled substance contract: n/a     Review of Systems  Constitutional:  Negative for diaphoresis.  Eyes:  Negative for pain.  Respiratory:  Negative for shortness of breath.   Cardiovascular:  Negative for chest pain, palpitations and leg swelling.  Gastrointestinal:  Negative for abdominal pain.  Endocrine:  Negative for polydipsia.  Skin:  Negative for rash.  Neurological:  Negative for dizziness, weakness and headaches.  Hematological:  Does not bruise/bleed easily.  All other systems reviewed and are negative.     Objective:   Physical Exam Vitals and nursing note reviewed.  Constitutional:      Appearance: Normal appearance. He is well-developed.  HENT:     Head: Normocephalic.     Nose: Nose normal.  Eyes:     Pupils: Pupils are equal, round, and reactive to light.  Neck:     Thyroid: No thyroid mass or thyromegaly.     Vascular: No carotid bruit or JVD.     Trachea: Phonation normal.  Cardiovascular:     Rate and Rhythm: Normal rate and regular rhythm.   Pulmonary:     Effort: Pulmonary effort is normal. No respiratory distress.     Breath sounds: Normal breath sounds.  Abdominal:     General: Bowel sounds are normal.     Palpations: Abdomen is soft.     Tenderness: There is no abdominal tenderness.  Musculoskeletal:        General: Normal range of motion.     Cervical back: Normal range of motion and neck supple.  Lymphadenopathy:     Cervical: No cervical adenopathy.  Skin:    General: Skin is warm and dry.  Neurological:     Mental Status: He is alert and oriented to person, place, and time.  Psychiatric:        Behavior: Behavior normal.        Thought Content: Thought content normal.        Judgment: Judgment normal.   BP 110/72   Pulse 83   Temp (!) 97.4 F (36.3 C) (Temporal)   Resp 20   Ht $R'5\' 8"'rr$  (1.727 m)   Wt 205 lb (93 kg)   SpO2 97%   BMI 31.17 kg/m   Hgba1c 6.7%      Assessment & Plan:  Shawn Malone comes in today with chief complaint of Medical Management of Chronic Issues (Skin tags on neck/)   Diagnosis and orders addressed:  1. Type 2 diabetes mellitus without complication, without long-term current use of insulin (HCC) Continue to wathc carbsin diet - Bayer DCA Hb A1c Waived - dapagliflozin propanediol (FARXIGA) 10 MG TABS tablet; Take 1 tablet (10 mg total) by mouth daily before breakfast.  Dispense: 90 tablet; Refill: 3 - Dulaglutide (TRULICITY) 1.5 OX/7.3ZH SOPN; Inject 1.5 mg into the skin once a week.  Dispense: 2 mL; Refill: 2 - pioglitazone (ACTOS) 45 MG tablet; Take 1 tablet (45 mg total) by mouth daily.  Dispense: 90 tablet; Refill: 1  2. Hypertension associated with diabetes ([place]) Low sodium diet - CBC with Differential/Platelet - CMP14+EGFR - metoprolol succinate (TOPROL-XL) 50 MG 24 hr tablet; Take 1 tablet (50 mg total) by mouth daily. Take with or immediately following a meal.  Dispense: 90 tablet; Refill: 1 - spironolactone (ALDACTONE) 25 MG tablet; Take 1 tablet (25 mg total) by  mouth daily.  Dispense: 90 tablet; Refill: 1  3. Hyperlipidemia associated with type 2 diabetes mellitus (HCC) Low fat diet - Lipid panel - gemfibrozil (LOPID) 600 MG tablet; Take 1 tablet (600 mg total) by mouth 2 (two) times daily before a meal.  Dispense: 180 tablet; Refill: 1 - rosuvastatin (CRESTOR) 10 MG tablet; Take 1 tablet (10 mg total) by mouth daily.  Dispense: 90 tablet; Refill: 1  4. Neuropathy Do not go barefooted -  gabapentin (NEURONTIN) 300 MG capsule; Take 2-3 capsules (600-900 mg total) by mouth 3 (three) times daily.  Dispense: 240 capsule; Refill: 3 - amitriptyline (ELAVIL) 150 MG tablet; Take 1 tablet (150 mg total) by mouth at bedtime.  Dispense: 90 tablet; Refill: 1  5. Primary insomnia Bedtime routine  6. BMI 33.0-33.9,adult Discussed diet and exercise for person with BMI >25 Will recheck weight in 3-6 months    Labs pending Health Maintenance reviewed Diet and exercise encouraged  Follow up plan: 3 months   Mary-Margaret Hassell Done, FNP

## 2021-06-13 ENCOUNTER — Telehealth: Payer: Self-pay | Admitting: Nurse Practitioner

## 2021-06-13 DIAGNOSIS — G629 Polyneuropathy, unspecified: Secondary | ICD-10-CM

## 2021-06-14 LAB — LIPID PANEL
Chol/HDL Ratio: 4.8 ratio (ref 0.0–5.0)
Cholesterol, Total: 171 mg/dL (ref 100–199)
HDL: 36 mg/dL — ABNORMAL LOW (ref >39)
LDL Chol Calc (NIH): 99 mg/dL (ref 0–99)
Triglycerides: 210 mg/dL — ABNORMAL HIGH (ref 0–149)
VLDL Cholesterol Cal: 36 mg/dL (ref 5–40)

## 2021-06-14 LAB — CBC WITH DIFFERENTIAL/PLATELET
Basophils Absolute: 0.1 10*3/uL (ref 0.0–0.2)
Basos: 1 %
EOS (ABSOLUTE): 0.2 10*3/uL (ref 0.0–0.4)
Eos: 2 %
Hematocrit: 46.5 % (ref 37.5–51.0)
Hemoglobin: 15.2 g/dL (ref 13.0–17.7)
Immature Grans (Abs): 0 10*3/uL (ref 0.0–0.1)
Immature Granulocytes: 0 %
Lymphocytes Absolute: 2.4 10*3/uL (ref 0.7–3.1)
Lymphs: 32 %
MCH: 30.9 pg (ref 26.6–33.0)
MCHC: 32.7 g/dL (ref 31.5–35.7)
MCV: 95 fL (ref 79–97)
Monocytes Absolute: 0.6 10*3/uL (ref 0.1–0.9)
Monocytes: 8 %
Neutrophils Absolute: 4.2 10*3/uL (ref 1.4–7.0)
Neutrophils: 57 %
Platelets: 197 10*3/uL (ref 150–450)
RBC: 4.92 x10E6/uL (ref 4.14–5.80)
RDW: 12.9 % (ref 11.6–15.4)
WBC: 7.4 10*3/uL (ref 3.4–10.8)

## 2021-06-14 LAB — CMP14+EGFR
ALT: 15 IU/L (ref 0–44)
AST: 16 IU/L (ref 0–40)
Albumin/Globulin Ratio: 2.1 (ref 1.2–2.2)
Albumin: 4.6 g/dL (ref 3.8–4.8)
Alkaline Phosphatase: 71 IU/L (ref 44–121)
BUN/Creatinine Ratio: 11 (ref 10–24)
BUN: 15 mg/dL (ref 8–27)
Bilirubin Total: 0.6 mg/dL (ref 0.0–1.2)
CO2: 23 mmol/L (ref 20–29)
Calcium: 9.7 mg/dL (ref 8.6–10.2)
Chloride: 98 mmol/L (ref 96–106)
Creatinine, Ser: 1.38 mg/dL — ABNORMAL HIGH (ref 0.76–1.27)
Globulin, Total: 2.2 g/dL (ref 1.5–4.5)
Glucose: 201 mg/dL — ABNORMAL HIGH (ref 70–99)
Potassium: 4.6 mmol/L (ref 3.5–5.2)
Sodium: 137 mmol/L (ref 134–144)
Total Protein: 6.8 g/dL (ref 6.0–8.5)
eGFR: 57 mL/min/{1.73_m2} — ABNORMAL LOW (ref >59)

## 2021-06-15 ENCOUNTER — Other Ambulatory Visit: Payer: Self-pay | Admitting: Nurse Practitioner

## 2021-06-15 DIAGNOSIS — G629 Polyneuropathy, unspecified: Secondary | ICD-10-CM

## 2021-06-16 NOTE — Telephone Encounter (Signed)
Calling to check on medication to see if the dose can be raised. Please call back

## 2021-07-25 ENCOUNTER — Encounter: Payer: Self-pay | Admitting: Nurse Practitioner

## 2021-07-25 ENCOUNTER — Ambulatory Visit: Payer: Medicaid Other | Admitting: Nurse Practitioner

## 2021-07-25 VITALS — BP 100/71 | HR 87 | Temp 97.2°F | Resp 20 | Ht 68.0 in | Wt 210.0 lb

## 2021-07-25 DIAGNOSIS — L918 Other hypertrophic disorders of the skin: Secondary | ICD-10-CM

## 2021-07-25 NOTE — Progress Notes (Signed)
° °  Subjective:    Patient ID: Shawn Malone, male    DOB: 01-Jun-1956, 65 y.o.   MRN: [id number]  Chief Complaint: Skin tag removal   HPI Patient come sin to day for removal of skin tags. On left side of neck.    Review of Systems  Constitutional:  Negative for diaphoresis.  Eyes:  Negative for pain.  Respiratory:  Negative for shortness of breath.   Cardiovascular:  Negative for chest pain, palpitations and leg swelling.  Gastrointestinal:  Negative for abdominal pain.  Endocrine: Negative for polydipsia.  Skin:  Negative for rash.  Neurological:  Negative for dizziness, weakness and headaches.  Hematological:  Does not bruise/bleed easily.  All other systems reviewed and are negative.     Objective:   Physical Exam Vitals reviewed.  Constitutional:      Appearance: Normal appearance.  Cardiovascular:     Rate and Rhythm: Normal rate and regular rhythm.     Heart sounds: Normal heart sounds.  Pulmonary:     Effort: Pulmonary effort is normal.     Breath sounds: Normal breath sounds.  Skin:    General: Skin is warm.     Comments: 8mm flesh colored skin tag on left side of neck  Neurological:     General: No focal deficit present.     Mental Status: He is alert and oriented to person, place, and time.   Skin excision  Date/Time: 07/25/2021 11:37 AM Performed by: Bennie Pierini, FNP Authorized by: Daphine Deutscher Mary-Margaret, FNP   Number of Lesions: 3 Lesion 1:    Body area: head/neck   Initial size (mm): 3   Malignancy: benign lesion     Closure complexity: simple     Repair comments: Silver nitrate stick Lesion 2:    Body area: head/neck   Initial size (mm): 1 Lesion 3:    Body area: head/neck   Head/neck location: neck   Initial size (mm): 1        Assessment & Plan:   Shawn Malone in today with chief complaint of Skin tag removal   1. Skin tag Keep area clean and dry Do not pick at area No special care needed    The above assessment and  management plan was discussed with the patient. The patient verbalized understanding of and has agreed to the management plan. Patient is aware to call the clinic if symptoms persist or worsen. Patient is aware when to return to the clinic for a follow-up visit. Patient educated on when it is appropriate to go to the emergency department.   Mary-Margaret Daphine Deutscher, FNP

## 2021-07-25 NOTE — Patient Instructions (Signed)
Skin Tag, Adult  A skin tag (acrochordon) is a soft, extra growth of skin. Most skin tags are skin-colored and rarely bigger than a pencil eraser. They commonly form in areas where there is frequent rubbing, or friction, on the skin. This may be where there are folds in the skin, such as the eyelids, neck, armpit, or groin. Skin tags are not dangerous, and they do not spread from person to person (are not contagious). You may have one skin tag or several. Skin tags do not require treatment. However, your health care provider may recommend removal of a skin tag if it: Gets irritated from clothing or jewelry. Bleeds. Is visible and unsightly. What are the causes? This condition is linked with: Increasing age. Pregnancy. Diabetes. Obesity. What are the signs or symptoms? Skin tags usually do not cause symptoms unless they get irritated by items touching your skin, such as clothing or jewelry. When this happens, you may have pain, itching, or bleeding. How is this diagnosed? This condition is diagnosed with an evaluation from your health care provider. No testing is needed for diagnosis. How is this treated? Treatment for this condition depends on whether you have symptoms. If a skin tag needs to be removed, your health care provider can remove it with: A simple surgical procedure using scissors. A procedure that involves freezing your skin tag with a gas in liquid form (liquid nitrogen). A procedure that uses heat to destroy your skin tag (electrodessication). Your health care provider may also remove your skin tag if it is visible or unsightly, Follow these instructions at home: Watch for any changes in your skin tag. A normal skin tag does not require any other special care at home. Take over-the-counter and prescription medicines only as told by your health care provider. Keep all follow-up visits as told by your health care provider. This is important. Contact a health care provider  if: You have a skin tag that: Becomes painful. Changes color. Bleeds. Swells. Summary Skin tags are soft, extra growths of skin found in areas of frequent rubbing or friction. Skin tags usually do not cause symptoms. If symptoms occur, you may have pain, itching, or bleeding. If your skin tag causes symptoms or is unsightly, your health care provider can remove it. This information is not intended to replace advice given to you by your health care provider. Make sure you discuss any questions you have with your health care provider. Document Revised: 05/18/2019 Document Reviewed: 05/18/2019 Elsevier Patient Education  2022 [employer] Inc[employer]  

## 2021-09-05 ENCOUNTER — Other Ambulatory Visit: Payer: Self-pay | Admitting: Nurse Practitioner

## 2021-09-05 DIAGNOSIS — E119 Type 2 diabetes mellitus without complications: Secondary | ICD-10-CM

## 2021-09-14 ENCOUNTER — Ambulatory Visit: Payer: Medicaid Other | Admitting: Nurse Practitioner

## 2021-09-14 ENCOUNTER — Encounter: Payer: Self-pay | Admitting: Nurse Practitioner

## 2021-09-14 VITALS — BP 112/70 | HR 88 | Temp 98.1°F | Resp 20 | Ht 68.0 in | Wt 213.0 lb

## 2021-09-14 DIAGNOSIS — I152 Hypertension secondary to endocrine disorders: Secondary | ICD-10-CM | POA: Diagnosis not present

## 2021-09-14 DIAGNOSIS — R2689 Other abnormalities of gait and mobility: Secondary | ICD-10-CM | POA: Diagnosis not present

## 2021-09-14 DIAGNOSIS — N521 Erectile dysfunction due to diseases classified elsewhere: Secondary | ICD-10-CM

## 2021-09-14 DIAGNOSIS — G629 Polyneuropathy, unspecified: Secondary | ICD-10-CM | POA: Diagnosis not present

## 2021-09-14 DIAGNOSIS — F5101 Primary insomnia: Secondary | ICD-10-CM

## 2021-09-14 DIAGNOSIS — E1159 Type 2 diabetes mellitus with other circulatory complications: Secondary | ICD-10-CM | POA: Diagnosis not present

## 2021-09-14 DIAGNOSIS — E1169 Type 2 diabetes mellitus with other specified complication: Secondary | ICD-10-CM | POA: Diagnosis not present

## 2021-09-14 DIAGNOSIS — E119 Type 2 diabetes mellitus without complications: Secondary | ICD-10-CM

## 2021-09-14 DIAGNOSIS — Z6833 Body mass index (BMI) 33.0-33.9, adult: Secondary | ICD-10-CM

## 2021-09-14 DIAGNOSIS — E785 Hyperlipidemia, unspecified: Secondary | ICD-10-CM | POA: Diagnosis not present

## 2021-09-14 LAB — BAYER DCA HB A1C WAIVED: HB A1C (BAYER DCA - WAIVED): 7.2 % — ABNORMAL HIGH (ref 4.8–5.6)

## 2021-09-14 MED ORDER — SILDENAFIL CITRATE 20 MG PO TABS: 20.0000 mg | ORAL_TABLET | Freq: Three times a day (TID) | ORAL | 5 refills | Status: DC

## 2021-09-14 MED ORDER — DAPAGLIFLOZIN PROPANEDIOL 10 MG PO TABS: 10.0000 mg | ORAL_TABLET | Freq: Every day | ORAL | 3 refills | Status: DC

## 2021-09-14 MED ORDER — ROSUVASTATIN CALCIUM 10 MG PO TABS: 10.0000 mg | ORAL_TABLET | Freq: Every day | ORAL | 1 refills | Status: DC

## 2021-09-14 MED ORDER — GEMFIBROZIL 600 MG PO TABS: 600.0000 mg | ORAL_TABLET | Freq: Two times a day (BID) | ORAL | 1 refills | Status: DC

## 2021-09-14 MED ORDER — GABAPENTIN 300 MG PO CAPS: 600.0000 mg | ORAL_CAPSULE | Freq: Three times a day (TID) | ORAL | 3 refills | Status: DC

## 2021-09-14 MED ORDER — AMITRIPTYLINE HCL 150 MG PO TABS: 150.0000 mg | ORAL_TABLET | Freq: Every day | ORAL | 1 refills | Status: DC

## 2021-09-14 MED ORDER — METOPROLOL SUCCINATE ER 50 MG PO TB24: 50.0000 mg | ORAL_TABLET | Freq: Every day | ORAL | 1 refills | Status: DC

## 2021-09-14 MED ORDER — PIOGLITAZONE HCL 45 MG PO TABS: 45.0000 mg | ORAL_TABLET | Freq: Every day | ORAL | 1 refills | Status: DC

## 2021-09-14 MED ORDER — TRULICITY 1.5 MG/0.5ML ~~LOC~~ SOAJ: 1.5000 mg | SUBCUTANEOUS | 0 refills | Status: DC

## 2021-09-14 MED ORDER — SPIRONOLACTONE 25 MG PO TABS: 25.0000 mg | ORAL_TABLET | Freq: Every day | ORAL | 1 refills | Status: DC

## 2021-09-14 NOTE — Progress Notes (Signed)
Subjective:    Patient ID: Shawn Malone, male    DOB: 1955-11-22, 66 y.o.   MRN: [id number]     HPI: medical management of chronic issues    Shawn Malone is a 65 y.o. who identifies as a male who was assigned male at birth.   Social history: Lives with: wife Work history: retired   Water engineer in today for follow up of the following chronic medical issues:  1. Hypertension associated with diabetes (HCC) No c/o chest pain, sob or headache. Does not check blood pressure at home. BP Readings from Last 3 Encounters:  07/25/21 100/71  06/12/21 110/72  04/17/21 118/70     2. Hyperlipidemia associated with type 2 diabetes mellitus (HCC) Doe s not watch diet and does no dedicated exercise. Lab Results  Component Value Date   CHOL 171 06/12/2021   HDL 36 (L) 06/12/2021   LDLCALC 99 06/12/2021   TRIG 210 (H) 06/12/2021   CHOLHDL 4.8 06/12/2021     3. Type 2 diabetes mellitus without complication, without long-term current use of insulin (HCC) Does not check blood sugars at home. Doe snot really watch diet. Lab Results  Component Value Date   HGBA1C 6.7 (H) 06/12/2021     4. Primary insomnia Is on amitriptyline to sleep.   5. BMI 33.0-33.9,adult Weight is up 3 lbs. Wt Readings from Last 3 Encounters:  09/14/21 213 lb (96.6 kg)  07/25/21 210 lb (95.3 kg)  06/12/21 205 lb (93 kg)   BMI Readings from Last 3 Encounters:  09/14/21 32.39 kg/m  07/25/21 31.93 kg/m  06/12/21 31.17 kg/m     New complaints: Having trouble with his balance  Allergies  Allergen Reactions   Metformin And Related     Stomach and feeling bad   Outpatient Encounter Medications as of 09/14/2021  Medication Sig   amitriptyline (ELAVIL) 150 MG tablet Take 1 tablet (150 mg total) by mouth at bedtime.   aspirin 325 MG EC tablet Take 325 mg by mouth daily.   dapagliflozin propanediol (FARXIGA) 10 MG TABS tablet Take 1 tablet (10 mg total) by mouth daily before breakfast.   gabapentin  (NEURONTIN) 300 MG capsule Take 2-3 capsules (600-900 mg total) by mouth 3 (three) times daily.   gemfibrozil (LOPID) 600 MG tablet Take 1 tablet (600 mg total) by mouth 2 (two) times daily before a meal.   metoprolol succinate (TOPROL-XL) 50 MG 24 hr tablet Take 1 tablet (50 mg total) by mouth daily. Take with or immediately following a meal.   pioglitazone (ACTOS) 45 MG tablet Take 1 tablet (45 mg total) by mouth daily.   rosuvastatin (CRESTOR) 10 MG tablet Take 1 tablet (10 mg total) by mouth daily.   sildenafil (REVATIO) 20 MG tablet Take 1-2 tablets (20-40 mg total) by mouth 3 (three) times daily.   spironolactone (ALDACTONE) 25 MG tablet Take 1 tablet (25 mg total) by mouth daily.   TRULICITY 1.5 MG/0.5ML SOPN INJECT 1.5 MG INTO THE SKIN ONCE A WEEK.   No facility-administered encounter medications on file as of 09/14/2021.    Past Surgical History:  Procedure Laterality Date   None     As of 08/10/16    Family History  Problem Relation Age of Onset   Cancer Mother    Colon cancer Brother 54   Lung cancer Brother    Liver disease Neg Hx       Controlled substance contract: n/a     Review of Systems  Constitutional:  Negative for diaphoresis.  Eyes:  Negative for pain.  Respiratory:  Negative for shortness of breath.   Cardiovascular:  Negative for chest pain, palpitations and leg swelling.  Gastrointestinal:  Negative for abdominal pain.  Endocrine: Negative for polydipsia.  Skin:  Negative for rash.  Neurological:  Negative for dizziness, weakness and headaches.  Hematological:  Does not bruise/bleed easily.  All other systems reviewed and are negative.     Objective:   Physical Exam Vitals and nursing note reviewed.  Constitutional:      Appearance: Normal appearance. He is well-developed.  HENT:     Head: Normocephalic.     Nose: Nose normal.     Mouth/Throat:     Mouth: Mucous membranes are moist.     Pharynx: Oropharynx is clear.  Eyes:     Pupils:  Pupils are equal, round, and reactive to light.  Neck:     Thyroid: No thyroid mass or thyromegaly.     Vascular: No carotid bruit or JVD.     Trachea: Phonation normal.  Cardiovascular:     Rate and Rhythm: Normal rate and regular rhythm.  Pulmonary:     Effort: Pulmonary effort is normal. No respiratory distress.     Breath sounds: Normal breath sounds.  Abdominal:     General: Bowel sounds are normal.     Palpations: Abdomen is soft.     Tenderness: There is no abdominal tenderness.  Musculoskeletal:        General: Normal range of motion.     Cervical back: Normal range of motion and neck supple.  Lymphadenopathy:     Cervical: No cervical adenopathy.  Skin:    General: Skin is warm and dry.  Neurological:     Mental Status: He is alert and oriented to person, place, and time.  Psychiatric:        Behavior: Behavior normal.        Thought Content: Thought content normal.        Judgment: Judgment normal.    BP 112/70    Pulse 88    Temp 98.1 F (36.7 C) (Temporal)    Resp 20    Ht $R'5\' 8"'Ny$  (1.727 m)    Wt 213 lb (96.6 kg)    SpO2 95%    BMI 32.39 kg/m   HGBA1c 7.2%     Assessment & Plan:   [name] comes in today with chief complaint of Medical Management of Chronic Issues   Diagnosis and orders addressed:  1. Hypertension associated with diabetes ([place]) Low sodium diet - CBC with Differential/Platelet - CMP14+EGFR - metoprolol succinate (TOPROL-XL) 50 MG 24 hr tablet; Take 1 tablet (50 mg total) by mouth daily. Take with or immediately following a meal.  Dispense: 90 tablet; Refill: 1 - spironolactone (ALDACTONE) 25 MG tablet; Take 1 tablet (25 mg total) by mouth daily.  Dispense: 90 tablet; Refill: 1  2. Hyperlipidemia associated with type 2 diabetes mellitus (HCC) Low fat diet - Lipid panel - gemfibrozil (LOPID) 600 MG tablet; Take 1 tablet (600 mg total) by mouth 2 (two) times daily before a meal.  Dispense: 180 tablet; Refill: 1 - rosuvastatin (CRESTOR)  10 MG tablet; Take 1 tablet (10 mg total) by mouth daily.  Dispense: 90 tablet; Refill: 1  3. Type 2 diabetes mellitus without complication, without long-term current use of insulin (HCC) Continueto watch carbsin diet - Bayer DCA Hb A1c Waived - Dulaglutide (TRULICITY) 1.5 EB/3.4DH SOPN; Inject 1.5 mg into the skin  once a week.  Dispense: 6 mL; Refill: 0 - pioglitazone (ACTOS) 45 MG tablet; Take 1 tablet (45 mg total) by mouth daily.  Dispense: 90 tablet; Refill: 1 - dapagliflozin propanediol (FARXIGA) 10 MG TABS tablet; Take 1 tablet (10 mg total) by mouth daily before breakfast.  Dispense: 90 tablet; Refill: 3  4. Primary insomnia Bedtime routine  5. BMI 33.0-33.9,adult Discussed diet and exercise for person with BMI >25 Will recheck weight in 3-6 months   6. Balance problem Exercise daily  7. Erectile dysfunction associated with type 2 diabetes mellitus (HCC)  - sildenafil (REVATIO) 20 MG tablet; Take 1-2 tablets (20-40 mg total) by mouth 3 (three) times daily.  Dispense: 20 tablet; Refill: 5  8. Neuropathy  - amitriptyline (ELAVIL) 150 MG tablet; Take 1 tablet (150 mg total) by mouth at bedtime.  Dispense: 90 tablet; Refill: 1 - gabapentin (NEURONTIN) 300 MG capsule; Take 2-3 capsules (600-900 mg total) by mouth 3 (three) times daily.  Dispense: 240 capsule; Refill: 3   Labs pending Health Maintenance reviewed Diet and exercise encouraged  Follow up plan: 3 months   Mary-Margaret Hassell Done, FNP

## 2021-09-14 NOTE — Patient Instructions (Signed)

## 2021-09-15 LAB — CMP14+EGFR
ALT: 18 IU/L (ref 0–44)
AST: 18 IU/L (ref 0–40)
Albumin/Globulin Ratio: 2 (ref 1.2–2.2)
Albumin: 4.7 g/dL (ref 3.8–4.8)
Alkaline Phosphatase: 66 IU/L (ref 44–121)
BUN/Creatinine Ratio: 9 — ABNORMAL LOW (ref 10–24)
BUN: 11 mg/dL (ref 8–27)
Bilirubin Total: 0.5 mg/dL (ref 0.0–1.2)
CO2: 23 mmol/L (ref 20–29)
Calcium: 9.8 mg/dL (ref 8.6–10.2)
Chloride: 101 mmol/L (ref 96–106)
Creatinine, Ser: 1.16 mg/dL (ref 0.76–1.27)
Globulin, Total: 2.4 g/dL (ref 1.5–4.5)
Glucose: 178 mg/dL — ABNORMAL HIGH (ref 70–99)
Potassium: 4.7 mmol/L (ref 3.5–5.2)
Sodium: 139 mmol/L (ref 134–144)
Total Protein: 7.1 g/dL (ref 6.0–8.5)
eGFR: 71 mL/min/{1.73_m2} (ref >59)

## 2021-09-15 LAB — CBC WITH DIFFERENTIAL/PLATELET
Basophils Absolute: 0.1 10*3/uL (ref 0.0–0.2)
Basos: 1 %
EOS (ABSOLUTE): 0.1 10*3/uL (ref 0.0–0.4)
Eos: 2 %
Hematocrit: 46 % (ref 37.5–51.0)
Hemoglobin: 15.7 g/dL (ref 13.0–17.7)
Immature Grans (Abs): 0 10*3/uL (ref 0.0–0.1)
Immature Granulocytes: 0 %
Lymphocytes Absolute: 2.4 10*3/uL (ref 0.7–3.1)
Lymphs: 32 %
MCH: 31.6 pg (ref 26.6–33.0)
MCHC: 34.1 g/dL (ref 31.5–35.7)
MCV: 93 fL (ref 79–97)
Monocytes Absolute: 0.5 10*3/uL (ref 0.1–0.9)
Monocytes: 7 %
Neutrophils Absolute: 4.3 10*3/uL (ref 1.4–7.0)
Neutrophils: 58 %
Platelets: 204 10*3/uL (ref 150–450)
RBC: 4.97 x10E6/uL (ref 4.14–5.80)
RDW: 12.7 % (ref 11.6–15.4)
WBC: 7.4 10*3/uL (ref 3.4–10.8)

## 2021-09-15 LAB — LIPID PANEL
Chol/HDL Ratio: 5.1 ratio — ABNORMAL HIGH (ref 0.0–5.0)
Cholesterol, Total: 158 mg/dL (ref 100–199)
HDL: 31 mg/dL — ABNORMAL LOW (ref >39)
LDL Chol Calc (NIH): 83 mg/dL (ref 0–99)
Triglycerides: 265 mg/dL — ABNORMAL HIGH (ref 0–149)
VLDL Cholesterol Cal: 44 mg/dL — ABNORMAL HIGH (ref 5–40)

## 2021-12-13 ENCOUNTER — Ambulatory Visit: Payer: Medicaid Other | Admitting: Nurse Practitioner

## 2021-12-13 ENCOUNTER — Encounter: Payer: Self-pay | Admitting: Nurse Practitioner

## 2021-12-13 VITALS — BP 102/61 | HR 86 | Temp 97.2°F | Resp 20 | Ht 68.0 in | Wt 213.0 lb

## 2021-12-13 DIAGNOSIS — F5101 Primary insomnia: Secondary | ICD-10-CM | POA: Diagnosis not present

## 2021-12-13 DIAGNOSIS — Z6833 Body mass index (BMI) 33.0-33.9, adult: Secondary | ICD-10-CM

## 2021-12-13 DIAGNOSIS — E785 Hyperlipidemia, unspecified: Secondary | ICD-10-CM | POA: Diagnosis not present

## 2021-12-13 DIAGNOSIS — N521 Erectile dysfunction due to diseases classified elsewhere: Secondary | ICD-10-CM | POA: Diagnosis not present

## 2021-12-13 DIAGNOSIS — E1169 Type 2 diabetes mellitus with other specified complication: Secondary | ICD-10-CM

## 2021-12-13 DIAGNOSIS — I152 Hypertension secondary to endocrine disorders: Secondary | ICD-10-CM

## 2021-12-13 DIAGNOSIS — G629 Polyneuropathy, unspecified: Secondary | ICD-10-CM

## 2021-12-13 DIAGNOSIS — E1159 Type 2 diabetes mellitus with other circulatory complications: Secondary | ICD-10-CM | POA: Diagnosis not present

## 2021-12-13 DIAGNOSIS — E119 Type 2 diabetes mellitus without complications: Secondary | ICD-10-CM

## 2021-12-13 LAB — BAYER DCA HB A1C WAIVED: HB A1C (BAYER DCA - WAIVED): 6.8 % — ABNORMAL HIGH (ref 4.8–5.6)

## 2021-12-13 NOTE — Progress Notes (Signed)
? ?Subjective:  ? ? Patient ID: Shawn Malone, male    DOB: 04-26-1956, 66 y.o.   MRN: [id number] ? ? ?Chief Complaint: medical management of chronic issues  ?  ? ?HPI: ? ?Shawn Malone is a 66 y.o. who identifies as a male who was assigned male at birth.  ? ?Social history: ?Lives with: wife ?Work history: retired ? ? ?Comes in today for follow up of the following chronic medical issues: ? ?1. Hypertension associated with diabetes ([place]) ?No c/o chest pain, sob or headache. Does check blood pressure at home on occasion. ?BP Readings from Last 3 Encounters:  ?12/13/21 102/61  ?09/14/21 112/70  ?07/25/21 100/71  ? ? ? ?2. Hyperlipidemia associated with type 2 diabetes mellitus ([place]) ?Does watch diet some but does no dedicated exercise. ?Lab Results  ?Component Value Date  ? CHOL 158 09/14/2021  ? HDL 31 (L) 09/14/2021  ? [place] 83 09/14/2021  ? TRIG 265 (H) 09/14/2021  ? CHOLHDL 5.1 (H) 09/14/2021  ? ? ? ?3. Type 2 diabetes mellitus without complication, without long-term current use of insulin ([place]) ?Fasting blood sugars are running around 110-130. No low blood sugars. ?Lab Results  ?Component Value Date  ? HGBA1C 7.2 (H) 09/14/2021  ? ? ? ?4. Neuropathy ?Has constant burning and tingling in feet. No lesions or numbness ? ?5. Primary insomnia ?Is on elavil and that helps him to sleep. He does not sleep well when his girlfriend is having issues from her chemo. ? ?6. BMI 33.0-33.9,adult ?No recent weight changes ?Wt Readings from Last 3 Encounters:  ?12/13/21 213 lb (96.6 kg)  ?09/14/21 213 lb (96.6 kg)  ?07/25/21 210 lb (95.3 kg)  ? ?BMI Readings from Last 3 Encounters:  ?12/13/21 32.39 kg/m?  ?09/14/21 32.39 kg/m?  ?07/25/21 31.93 kg/m?  ? ? ? ? ?New complaints: ?None today ? ?Allergies  ?Allergen Reactions  ? Metformin And Related   ?  Stomach and feeling bad  ? ?Outpatient Encounter Medications as of 12/13/2021  ?Medication Sig  ? amitriptyline (ELAVIL) 150 MG tablet Take 1 tablet (150 mg total) by mouth at bedtime.   ? aspirin 325 MG EC tablet Take 325 mg by mouth daily.  ? dapagliflozin propanediol (FARXIGA) 10 MG TABS tablet Take 1 tablet (10 mg total) by mouth daily before breakfast.  ? Dulaglutide (TRULICITY) 1.5 GM/0.1UU SOPN Inject 1.5 mg into the skin once a week.  ? gabapentin (NEURONTIN) 300 MG capsule Take 2-3 capsules (600-900 mg total) by mouth 3 (three) times daily.  ? gemfibrozil (LOPID) 600 MG tablet Take 1 tablet (600 mg total) by mouth 2 (two) times daily before a meal.  ? metoprolol succinate (TOPROL-XL) 50 MG 24 hr tablet Take 1 tablet (50 mg total) by mouth daily. Take with or immediately following a meal.  ? pioglitazone (ACTOS) 45 MG tablet Take 1 tablet (45 mg total) by mouth daily.  ? rosuvastatin (CRESTOR) 10 MG tablet Take 1 tablet (10 mg total) by mouth daily.  ? sildenafil (REVATIO) 20 MG tablet Take 1-2 tablets (20-40 mg total) by mouth 3 (three) times daily.  ? spironolactone (ALDACTONE) 25 MG tablet Take 1 tablet (25 mg total) by mouth daily.  ? ?No facility-administered encounter medications on file as of 12/13/2021.  ? ? ?Past Surgical History:  ?Procedure Laterality Date  ? None    ? As of 08/10/16  ? ? ?Family History  ?Problem Relation Age of Onset  ? Cancer Mother   ? Colon cancer Brother 7  ?  Lung cancer Brother   ? Liver disease Neg Hx   ? ? ? ? ?Controlled substance contract: n/a ? ? ? ? ?Review of Systems  ?Constitutional:  Negative for diaphoresis.  ?Eyes:  Negative for pain.  ?Respiratory:  Negative for shortness of breath.   ?Cardiovascular:  Negative for chest pain, palpitations and leg swelling.  ?Gastrointestinal:  Negative for abdominal pain.  ?Endocrine: Negative for polydipsia.  ?Skin:  Negative for rash.  ?Neurological:  Negative for dizziness, weakness and headaches.  ?Hematological:  Does not bruise/bleed easily.  ?All other systems reviewed and are negative. ? ?   ?Objective:  ? Physical Exam ?Vitals and nursing note reviewed.  ?Constitutional:   ?   Appearance: Normal  appearance. He is well-developed.  ?HENT:  ?   Head: Normocephalic.  ?   Nose: Nose normal.  ?   Mouth/Throat:  ?   Mouth: Mucous membranes are moist.  ?   Pharynx: Oropharynx is clear.  ?Eyes:  ?   Pupils: Pupils are equal, round, and reactive to light.  ?Neck:  ?   Thyroid: No thyroid mass or thyromegaly.  ?   Vascular: No carotid bruit or JVD.  ?   Trachea: Phonation normal.  ?Cardiovascular:  ?   Rate and Rhythm: Normal rate and regular rhythm.  ?Pulmonary:  ?   Effort: Pulmonary effort is normal. No respiratory distress.  ?   Breath sounds: Normal breath sounds.  ?Abdominal:  ?   General: Bowel sounds are normal.  ?   Palpations: Abdomen is soft.  ?   Tenderness: There is no abdominal tenderness.  ?Musculoskeletal:     ?   General: Normal range of motion.  ?   Cervical back: Normal range of motion and neck supple.  ?Lymphadenopathy:  ?   Cervical: No cervical adenopathy.  ?Skin: ?   General: Skin is warm and dry.  ?Neurological:  ?   Mental Status: He is alert and oriented to person, place, and time.  ?Psychiatric:     ?   Behavior: Behavior normal.     ?   Thought Content: Thought content normal.     ?   Judgment: Judgment normal.  ? ? ?BP 102/61   Pulse 86   Temp (!) 97.2 ?F (36.2 ?C) (Temporal)   Resp 20   Ht $R'5\' 8"'oO$  (1.727 m)   Wt 213 lb (96.6 kg)   SpO2 96%   BMI 32.39 kg/m?  ? ?Hgba1c 6.8% ? ? ?   ?Assessment & Plan:  ?Shawn Malone comes in today with chief complaint of No chief complaint on file. ? ? ?Diagnosis and orders addressed: ? ?1. Hypertension associated with diabetes ([place]) ?Low sodium diet ?- CMP14+EGFR ?- CBC with Differential/Platelet ? ?2. Hyperlipidemia associated with type 2 diabetes mellitus ([place]) ?Low fat diet ?- Lipid panel ? ?3. Type 2 diabetes mellitus without complication, without long-term current use of insulin ([place]) ?Continue to wtahc carbsin diet ?- Bayer DCA Hb A1c Waived ? ?4. Neuropathy ?Keep legs warm ?Do not go barefooted ? ?5. Primary insomnia ?Bedtime routine ?Continue  elavil as prescribed ? ?6. BMI 33.0-33.9,adult ?Discussed diet and exercise for person with BMI >25 ?Will recheck weight in 3-6 months ? ? ?7. Erectile dysfunction associated with type 2 diabetes mellitus ([place]) ? ? ?Labs pending ?Health Maintenance reviewed ?Diet and exercise encouraged ? ?Follow up plan: ?3 months ? ? ?Mary-Margaret Hassell Done, FNP ? ? ?

## 2021-12-13 NOTE — Patient Instructions (Signed)
Diabetes Mellitus and Foot Care Foot care is an important part of your health, especially when you have diabetes. Diabetes may cause you to have problems because of poor blood flow (circulation) to your feet and legs, which can cause your skin to: Become thinner and drier. Break more easily. Heal more slowly. Peel and crack. You may also have nerve damage (neuropathy) in your legs and feet, causing decreased feeling in them. This means that you may not notice minor injuries to your feet that could lead to more serious problems. Noticing and addressing any potential problems early is the best way to prevent future foot problems. How to care for your feet Foot hygiene  Wash your feet daily with warm water and mild soap. Do not use hot water. Then, pat your feet and the areas between your toes until they are completely dry. Do not soak your feet as this can dry your skin. Trim your toenails straight across. Do not dig under them or around the cuticle. File the edges of your nails with an emery board or nail file. Apply a moisturizing lotion or petroleum jelly to the skin on your feet and to dry, brittle toenails. Use lotion that does not contain alcohol and is unscented. Do not apply lotion between your toes. Shoes and socks Wear clean socks or stockings every day. Make sure they are not too tight. Do not wear knee-high stockings since they may decrease blood flow to your legs. Wear shoes that fit properly and have enough cushioning. Always look in your shoes before you put them on to be sure there are no objects inside. To break in new shoes, wear them for just a few hours a day. This prevents injuries on your feet. Wounds, scrapes, corns, and calluses  Check your feet daily for blisters, cuts, bruises, sores, and redness. If you cannot see the bottom of your feet, use a mirror or ask someone for help. Do not cut corns or calluses or try to remove them with medicine. If you find a minor scrape,  cut, or break in the skin on your feet, keep it and the skin around it clean and dry. You may clean these areas with mild soap and water. Do not clean the area with peroxide, alcohol, or iodine. If you have a wound, scrape, corn, or callus on your foot, look at it several times a day to make sure it is healing and not infected. Check for: Redness, swelling, or pain. Fluid or blood. Warmth. Pus or a bad smell. General tips Do not cross your legs. This may decrease blood flow to your feet. Do not use heating pads or hot water bottles on your feet. They may burn your skin. If you have lost feeling in your feet or legs, you may not know this is happening until it is too late. Protect your feet from hot and cold by wearing shoes, such as at the beach or on hot pavement. Schedule a complete foot exam at least once a year (annually) or more often if you have foot problems. Report any cuts, sores, or bruises to your health care provider immediately. Where to find more information American Diabetes Association: www.diabetes.org Association of Diabetes Care & Education Specialists: www.diabeteseducator.org Contact a health care provider if: You have a medical condition that increases your risk of infection and you have any cuts, sores, or bruises on your feet. You have an injury that is not healing. You have redness on your legs or feet. You   feel burning or tingling in your legs or feet. You have pain or cramps in your legs and feet. Your legs or feet are numb. Your feet always feel cold. You have pain around any toenails. Get help right away if: You have a wound, scrape, corn, or callus on your foot and: You have pain, swelling, or redness that gets worse. You have fluid or blood coming from the wound, scrape, corn, or callus. Your wound, scrape, corn, or callus feels warm to the touch. You have pus or a bad smell coming from the wound, scrape, corn, or callus. You have a fever. You have a red  line going up your leg. Summary Check your feet every day for blisters, cuts, bruises, sores, and redness. Apply a moisturizing lotion or petroleum jelly to the skin on your feet and to dry, brittle toenails. Wear shoes that fit properly and have enough cushioning. If you have foot problems, report any cuts, sores, or bruises to your health care provider immediately. Schedule a complete foot exam at least once a year (annually) or more often if you have foot problems. This information is not intended to replace advice given to you by your health care provider. Make sure you discuss any questions you have with your health care provider. Document Revised: 02/04/2020 Document Reviewed: 02/04/2020 Elsevier Patient Education  2023 Elsevier Inc.  

## 2021-12-14 LAB — LIPID PANEL
Chol/HDL Ratio: 5 ratio (ref 0.0–5.0)
Cholesterol, Total: 166 mg/dL (ref 100–199)
HDL: 33 mg/dL — ABNORMAL LOW (ref >39)
LDL Chol Calc (NIH): 93 mg/dL (ref 0–99)
Triglycerides: 236 mg/dL — ABNORMAL HIGH (ref 0–149)
VLDL Cholesterol Cal: 40 mg/dL (ref 5–40)

## 2021-12-14 LAB — CMP14+EGFR
ALT: 18 IU/L (ref 0–44)
AST: 20 IU/L (ref 0–40)
Albumin/Globulin Ratio: 1.9 (ref 1.2–2.2)
Albumin: 4.5 g/dL (ref 3.8–4.8)
Alkaline Phosphatase: 74 IU/L (ref 44–121)
BUN/Creatinine Ratio: 11 (ref 10–24)
BUN: 14 mg/dL (ref 8–27)
Bilirubin Total: 0.7 mg/dL (ref 0.0–1.2)
CO2: 25 mmol/L (ref 20–29)
Calcium: 9.5 mg/dL (ref 8.6–10.2)
Chloride: 100 mmol/L (ref 96–106)
Creatinine, Ser: 1.22 mg/dL (ref 0.76–1.27)
Globulin, Total: 2.4 g/dL (ref 1.5–4.5)
Glucose: 141 mg/dL — ABNORMAL HIGH (ref 70–99)
Potassium: 4.4 mmol/L (ref 3.5–5.2)
Sodium: 139 mmol/L (ref 134–144)
Total Protein: 6.9 g/dL (ref 6.0–8.5)
eGFR: 66 mL/min/{1.73_m2} (ref >59)

## 2021-12-14 LAB — CBC WITH DIFFERENTIAL/PLATELET
Basophils Absolute: 0.1 10*3/uL (ref 0.0–0.2)
Basos: 1 %
EOS (ABSOLUTE): 0.2 10*3/uL (ref 0.0–0.4)
Eos: 2 %
Hematocrit: 45.8 % (ref 37.5–51.0)
Hemoglobin: 15.5 g/dL (ref 13.0–17.7)
Immature Grans (Abs): 0 10*3/uL (ref 0.0–0.1)
Immature Granulocytes: 0 %
Lymphocytes Absolute: 2.7 10*3/uL (ref 0.7–3.1)
Lymphs: 37 %
MCH: 31.1 pg (ref 26.6–33.0)
MCHC: 33.8 g/dL (ref 31.5–35.7)
MCV: 92 fL (ref 79–97)
Monocytes Absolute: 0.6 10*3/uL (ref 0.1–0.9)
Monocytes: 8 %
Neutrophils Absolute: 3.8 10*3/uL (ref 1.4–7.0)
Neutrophils: 52 %
Platelets: 227 10*3/uL (ref 150–450)
RBC: 4.99 x10E6/uL (ref 4.14–5.80)
RDW: 12.8 % (ref 11.6–15.4)
WBC: 7.3 10*3/uL (ref 3.4–10.8)

## 2021-12-22 DIAGNOSIS — H2511 Age-related nuclear cataract, right eye: Secondary | ICD-10-CM | POA: Diagnosis not present

## 2022-01-04 ENCOUNTER — Other Ambulatory Visit: Payer: Self-pay | Admitting: Nurse Practitioner

## 2022-01-04 DIAGNOSIS — G629 Polyneuropathy, unspecified: Secondary | ICD-10-CM

## 2022-02-16 ENCOUNTER — Other Ambulatory Visit: Payer: Self-pay | Admitting: Nurse Practitioner

## 2022-02-16 DIAGNOSIS — E119 Type 2 diabetes mellitus without complications: Secondary | ICD-10-CM

## 2022-02-16 LAB — HM DIABETES EYE EXAM

## 2022-03-04 DIAGNOSIS — H5213 Myopia, bilateral: Secondary | ICD-10-CM | POA: Diagnosis not present

## 2022-03-16 ENCOUNTER — Encounter: Payer: Self-pay | Admitting: Nurse Practitioner

## 2022-03-16 ENCOUNTER — Ambulatory Visit: Payer: Medicaid Other | Admitting: Nurse Practitioner

## 2022-03-16 VITALS — BP 107/72 | HR 87 | Temp 97.5°F | Resp 20 | Ht 68.0 in | Wt 210.0 lb

## 2022-03-16 DIAGNOSIS — E785 Hyperlipidemia, unspecified: Secondary | ICD-10-CM | POA: Diagnosis not present

## 2022-03-16 DIAGNOSIS — F5101 Primary insomnia: Secondary | ICD-10-CM

## 2022-03-16 DIAGNOSIS — Z6833 Body mass index (BMI) 33.0-33.9, adult: Secondary | ICD-10-CM | POA: Diagnosis not present

## 2022-03-16 DIAGNOSIS — E119 Type 2 diabetes mellitus without complications: Secondary | ICD-10-CM | POA: Diagnosis not present

## 2022-03-16 DIAGNOSIS — E1159 Type 2 diabetes mellitus with other circulatory complications: Secondary | ICD-10-CM

## 2022-03-16 DIAGNOSIS — G629 Polyneuropathy, unspecified: Secondary | ICD-10-CM | POA: Diagnosis not present

## 2022-03-16 DIAGNOSIS — I152 Hypertension secondary to endocrine disorders: Secondary | ICD-10-CM

## 2022-03-16 DIAGNOSIS — E1169 Type 2 diabetes mellitus with other specified complication: Secondary | ICD-10-CM

## 2022-03-16 DIAGNOSIS — R7989 Other specified abnormal findings of blood chemistry: Secondary | ICD-10-CM | POA: Diagnosis not present

## 2022-03-16 LAB — BAYER DCA HB A1C WAIVED: HB A1C (BAYER DCA - WAIVED): 6.6 % — ABNORMAL HIGH (ref 4.8–5.6)

## 2022-03-16 MED ORDER — TRULICITY 1.5 MG/0.5ML ~~LOC~~ SOAJ: 1.5000 mg | SUBCUTANEOUS | 1 refills | Status: DC

## 2022-03-16 MED ORDER — SPIRONOLACTONE 25 MG PO TABS: 25.0000 mg | ORAL_TABLET | Freq: Every day | ORAL | 1 refills | Status: DC

## 2022-03-16 MED ORDER — DAPAGLIFLOZIN PROPANEDIOL 10 MG PO TABS: 10.0000 mg | ORAL_TABLET | Freq: Every day | ORAL | 3 refills | Status: DC

## 2022-03-16 MED ORDER — GABAPENTIN 300 MG PO CAPS: 600.0000 mg | ORAL_CAPSULE | Freq: Three times a day (TID) | ORAL | 1 refills | Status: DC

## 2022-03-16 MED ORDER — METOPROLOL SUCCINATE ER 50 MG PO TB24: 50.0000 mg | ORAL_TABLET | Freq: Every day | ORAL | 1 refills | Status: DC

## 2022-03-16 MED ORDER — GEMFIBROZIL 600 MG PO TABS: 600.0000 mg | ORAL_TABLET | Freq: Two times a day (BID) | ORAL | 1 refills | Status: DC

## 2022-03-16 MED ORDER — AMITRIPTYLINE HCL 150 MG PO TABS: 150.0000 mg | ORAL_TABLET | Freq: Every day | ORAL | 1 refills | Status: DC

## 2022-03-16 MED ORDER — PIOGLITAZONE HCL 45 MG PO TABS: 45.0000 mg | ORAL_TABLET | Freq: Every day | ORAL | 1 refills | Status: DC

## 2022-03-16 NOTE — Progress Notes (Signed)
Subjective:    Patient ID: Shawn Malone, male    DOB: February 26, 1956, 66 y.o.   MRN: [id number]   Chief Complaint: medical management of chronic issues     HPI:  Shawn Malone is a 66 y.o. who identifies as a male who was assigned male at birth.   Social history: Lives with: wife Work history: disability   Comes in today for follow up of the following chronic medical issues:  1. Hypertension associated with diabetes ([place]) Np c/o chest pain, sob or headache. Does not check blood pressure at home. BP Readings from Last 3 Encounters:  12/13/21 102/61  09/14/21 112/70  07/25/21 100/71     2. Hyperlipidemia associated with type 2 diabetes mellitus ([place]) Does try to watch diet but does very little exercise. Lab Results  Component Value Date   CHOL 166 12/13/2021   HDL 33 (L) 12/13/2021   LDLCALC 93 12/13/2021   TRIG 236 (H) 12/13/2021   CHOLHDL 5.0 12/13/2021     3. Type 2 diabetes mellitus without complication, without long-term current use of insulin ([facility]) He does not check his blood sugars very often. Lab Results  Component Value Date   HGBA1C 6.8 (H) 12/13/2021     4. Neuropathy Has burning in bil feet. Denies any foot lesions or calluses  5. Abnormal LFTs Lab Results  Component Value Date   ALT 18 12/13/2021   AST 20 12/13/2021   ALKPHOS 74 12/13/2021   BILITOT 0.7 12/13/2021     6. Rosanna Randy disease No jaundice as of late  7. Primary insomnia No problems sleeping  8. BMI 33.0-33.9,adult Weight is down 3 lbs Wt Readings from Last 3 Encounters:  03/16/22 210 lb (95.3 kg)  12/13/21 213 lb (96.6 kg)  09/14/21 213 lb (96.6 kg)   BMI Readings from Last 3 Encounters:  03/16/22 31.93 kg/m  12/13/21 32.39 kg/m  09/14/21 32.39 kg/m     New complaints: None today  Allergies  Allergen Reactions   Metformin And Related     Stomach and feeling bad   Outpatient Encounter Medications as of 03/16/2022  Medication Sig   amitriptyline (ELAVIL) 150  MG tablet Take 1 tablet (150 mg total) by mouth at bedtime.   aspirin 325 MG EC tablet Take 325 mg by mouth daily.   dapagliflozin propanediol (FARXIGA) 10 MG TABS tablet Take 1 tablet (10 mg total) by mouth daily before breakfast.   Dulaglutide (TRULICITY) 1.5 XV/4.0GQ SOPN Inject 1.5 mg into the skin once a week. (NEEDS TO BE SEEN BEFORE NEXT REFILL)   gabapentin (NEURONTIN) 300 MG capsule TAKE 2-3 CAPSULES (600-900 MG TOTAL) BY MOUTH 3 (THREE) TIMES DAILY.   gemfibrozil (LOPID) 600 MG tablet Take 1 tablet (600 mg total) by mouth 2 (two) times daily before a meal.   metoprolol succinate (TOPROL-XL) 50 MG 24 hr tablet Take 1 tablet (50 mg total) by mouth daily. Take with or immediately following a meal.   pioglitazone (ACTOS) 45 MG tablet Take 1 tablet (45 mg total) by mouth daily.   rosuvastatin (CRESTOR) 10 MG tablet Take 1 tablet (10 mg total) by mouth daily.   sildenafil (REVATIO) 20 MG tablet Take 1-2 tablets (20-40 mg total) by mouth 3 (three) times daily.   spironolactone (ALDACTONE) 25 MG tablet Take 1 tablet (25 mg total) by mouth daily.   No facility-administered encounter medications on file as of 03/16/2022.    Past Surgical History:  Procedure Laterality Date   None  As of 08/10/16    Family History  Problem Relation Age of Onset   Cancer Mother    Colon cancer Brother 78   Lung cancer Brother    Liver disease Neg Hx       Controlled substance contract: n/a     Review of Systems  Constitutional:  Negative for diaphoresis.  Eyes:  Negative for pain.  Respiratory:  Negative for shortness of breath.   Cardiovascular:  Negative for chest pain, palpitations and leg swelling.  Gastrointestinal:  Negative for abdominal pain.  Endocrine: Negative for polydipsia.  Skin:  Negative for rash.  Neurological:  Negative for dizziness, weakness and headaches.  Hematological:  Does not bruise/bleed easily.  All other systems reviewed and are negative.      Objective:    Physical Exam Vitals and nursing note reviewed.  Constitutional:      Appearance: Normal appearance. He is well-developed.  HENT:     Head: Normocephalic.     Nose: Nose normal.     Mouth/Throat:     Mouth: Mucous membranes are moist.     Pharynx: Oropharynx is clear.  Eyes:     Pupils: Pupils are equal, round, and reactive to light.  Neck:     Thyroid: No thyroid mass or thyromegaly.     Vascular: No carotid bruit or JVD.     Trachea: Phonation normal.  Cardiovascular:     Rate and Rhythm: Normal rate and regular rhythm.  Pulmonary:     Effort: Pulmonary effort is normal. No respiratory distress.     Breath sounds: Normal breath sounds.  Abdominal:     General: Bowel sounds are normal.     Palpations: Abdomen is soft.     Tenderness: There is no abdominal tenderness.  Musculoskeletal:        General: Normal range of motion.     Cervical back: Normal range of motion and neck supple.  Lymphadenopathy:     Cervical: No cervical adenopathy.  Skin:    General: Skin is warm and dry.  Neurological:     Mental Status: He is alert and oriented to person, place, and time.  Psychiatric:        Behavior: Behavior normal.        Thought Content: Thought content normal.        Judgment: Judgment normal.     BP 107/72   Pulse 87   Temp (!) 97.5 F (36.4 C) (Oral)   Resp 20   Ht _0  (1.727 m)   Wt 210 lb (95.3 kg)   SpO2 95%   BMI 31.93 kg/m        Assessment & Plan:   Shawn Malone comes in today with chief complaint of Medical Management of Chronic Issues   Diagnosis and orders addressed:  1. Hypertension associated with diabetes ([name]) Low sodium diet - CBC with Differential/Platelet - CMP14+EGFR - metoprolol succinate (TOPROL-XL) 50 MG 24 hr tablet; Take 1 tablet (50 mg total) by mouth daily. Take with or immediately following a meal.  Dispense: 90 tablet; Refill: 1 - spironolactone (ALDACTONE) 25 MG tablet; Take 1 tablet (25 mg total) by mouth daily.   Dispense: 90 tablet; Refill: 1  2. Hyperlipidemia associated with type 2 diabetes mellitus ([facility]) Low fta diet - Lipid panel - gemfibrozil (LOPID) 600 MG tablet; Take 1 tablet (600 mg total) by mouth 2 (two) times daily before a meal.  Dispense: 180 tablet; Refill: 1  3. Type 2 diabetes mellitus  without complication, without long-term current use of insulin ([facility]) Continue to watch carbs in diet - Bayer DCA Hb A1c Waived; Future - Dulaglutide (TRULICITY) 1.5 AL/4.2IM SOPN; Inject 1.5 mg into the skin once a week. (NEEDS TO BE SEEN BEFORE NEXT REFILL)  Dispense: 6 mL; Refill: 1 - pioglitazone (ACTOS) 45 MG tablet; Take 1 tablet (45 mg total) by mouth daily.  Dispense: 90 tablet; Refill: 1 - dapagliflozin propanediol (FARXIGA) 10 MG TABS tablet; Take 1 tablet (10 mg total) by mouth daily before breakfast.  Dispense: 90 tablet; Refill: 3  4. Neuropathy Do not go barefooted Ceck feet daily - gabapentin (NEURONTIN) 300 MG capsule; Take 2-3 capsules (600-900 mg total) by mouth 3 (three) times daily.  Dispense: 240 capsule; Refill: 1 - amitriptyline (ELAVIL) 150 MG tablet; Take 1 tablet (150 mg total) by mouth at bedtime.  Dispense: 90 tablet; Refill: 1  5. Abnormal LFTs Labs pending  6. Gilbert disease  7. Primary insomnia Bedtime routine  8. BMI 33.0-33.9,adult Discussed diet and exercise for person with BMI >25 Will recheck weight in 3-6 months    Labs pending Health Maintenance reviewed Diet and exercise encouraged  Follow up plan: 3 months   Mary-Margaret Hassell Done, FNP

## 2022-03-16 NOTE — Patient Instructions (Signed)
Diabetes Mellitus and Foot Care Foot care is an important part of your health, especially when you have diabetes. Diabetes may cause you to have problems because of poor blood flow (circulation) to your feet and legs, which can cause your skin to: Become thinner and drier. Break more easily. Heal more slowly. Peel and crack. You may also have nerve damage (neuropathy) in your legs and feet, causing decreased feeling in them. This means that you may not notice minor injuries to your feet that could lead to more serious problems. Noticing and addressing any potential problems early is the best way to prevent future foot problems. How to care for your feet Foot hygiene  Wash your feet daily with warm water and mild soap. Do not use hot water. Then, pat your feet and the areas between your toes until they are completely dry. Do not soak your feet as this can dry your skin. Trim your toenails straight across. Do not dig under them or around the cuticle. File the edges of your nails with an emery board or nail file. Apply a moisturizing lotion or petroleum jelly to the skin on your feet and to dry, brittle toenails. Use lotion that does not contain alcohol and is unscented. Do not apply lotion between your toes. Shoes and socks Wear clean socks or stockings every day. Make sure they are not too tight. Do not wear knee-high stockings since they may decrease blood flow to your legs. Wear shoes that fit properly and have enough cushioning. Always look in your shoes before you put them on to be sure there are no objects inside. To break in new shoes, wear them for just a few hours a day. This prevents injuries on your feet. Wounds, scrapes, corns, and calluses  Check your feet daily for blisters, cuts, bruises, sores, and redness. If you cannot see the bottom of your feet, use a mirror or ask someone for help. Do not cut corns or calluses or try to remove them with medicine. If you find a minor scrape,  cut, or break in the skin on your feet, keep it and the skin around it clean and dry. You may clean these areas with mild soap and water. Do not clean the area with peroxide, alcohol, or iodine. If you have a wound, scrape, corn, or callus on your foot, look at it several times a day to make sure it is healing and not infected. Check for: Redness, swelling, or pain. Fluid or blood. Warmth. Pus or a bad smell. General tips Do not cross your legs. This may decrease blood flow to your feet. Do not use heating pads or hot water bottles on your feet. They may burn your skin. If you have lost feeling in your feet or legs, you may not know this is happening until it is too late. Protect your feet from hot and cold by wearing shoes, such as at the beach or on hot pavement. Schedule a complete foot exam at least once a year (annually) or more often if you have foot problems. Report any cuts, sores, or bruises to your health care provider immediately. Where to find more information American Diabetes Association: www.diabetes.org Association of Diabetes Care & Education Specialists: www.diabeteseducator.org Contact a health care provider if: You have a medical condition that increases your risk of infection and you have any cuts, sores, or bruises on your feet. You have an injury that is not healing. You have redness on your legs or feet. You   feel burning or tingling in your legs or feet. You have pain or cramps in your legs and feet. Your legs or feet are numb. Your feet always feel cold. You have pain around any toenails. Get help right away if: You have a wound, scrape, corn, or callus on your foot and: You have pain, swelling, or redness that gets worse. You have fluid or blood coming from the wound, scrape, corn, or callus. Your wound, scrape, corn, or callus feels warm to the touch. You have pus or a bad smell coming from the wound, scrape, corn, or callus. You have a fever. You have a red  line going up your leg. Summary Check your feet every day for blisters, cuts, bruises, sores, and redness. Apply a moisturizing lotion or petroleum jelly to the skin on your feet and to dry, brittle toenails. Wear shoes that fit properly and have enough cushioning. If you have foot problems, report any cuts, sores, or bruises to your health care provider immediately. Schedule a complete foot exam at least once a year (annually) or more often if you have foot problems. This information is not intended to replace advice given to you by your health care provider. Make sure you discuss any questions you have with your health care provider. Document Revised: 02/04/2020 Document Reviewed: 02/04/2020 Elsevier Patient Education  2023 Elsevier Inc.  

## 2022-03-17 LAB — CMP14+EGFR
ALT: 11 IU/L (ref 0–44)
AST: 17 IU/L (ref 0–40)
Albumin/Globulin Ratio: 1.7 (ref 1.2–2.2)
Albumin: 4.2 g/dL (ref 3.9–4.9)
Alkaline Phosphatase: 62 IU/L (ref 44–121)
BUN/Creatinine Ratio: 8 — ABNORMAL LOW (ref 10–24)
BUN: 11 mg/dL (ref 8–27)
Bilirubin Total: 0.7 mg/dL (ref 0.0–1.2)
CO2: 23 mmol/L (ref 20–29)
Calcium: 9.8 mg/dL (ref 8.6–10.2)
Chloride: 99 mmol/L (ref 96–106)
Creatinine, Ser: 1.32 mg/dL — ABNORMAL HIGH (ref 0.76–1.27)
Globulin, Total: 2.5 g/dL (ref 1.5–4.5)
Glucose: 105 mg/dL — ABNORMAL HIGH (ref 70–99)
Potassium: 4.9 mmol/L (ref 3.5–5.2)
Sodium: 138 mmol/L (ref 134–144)
Total Protein: 6.7 g/dL (ref 6.0–8.5)
eGFR: 60 mL/min/{1.73_m2} (ref >59)

## 2022-03-17 LAB — CBC WITH DIFFERENTIAL/PLATELET
Basophils Absolute: 0.1 10*3/uL (ref 0.0–0.2)
Basos: 1 %
EOS (ABSOLUTE): 0.1 10*3/uL (ref 0.0–0.4)
Eos: 2 %
Hematocrit: 45.5 % (ref 37.5–51.0)
Hemoglobin: 15.1 g/dL (ref 13.0–17.7)
Immature Grans (Abs): 0 10*3/uL (ref 0.0–0.1)
Immature Granulocytes: 0 %
Lymphocytes Absolute: 2.7 10*3/uL (ref 0.7–3.1)
Lymphs: 36 %
MCH: 31.3 pg (ref 26.6–33.0)
MCHC: 33.2 g/dL (ref 31.5–35.7)
MCV: 94 fL (ref 79–97)
Monocytes Absolute: 0.7 10*3/uL (ref 0.1–0.9)
Monocytes: 9 %
Neutrophils Absolute: 3.9 10*3/uL (ref 1.4–7.0)
Neutrophils: 52 %
Platelets: 206 10*3/uL (ref 150–450)
RBC: 4.83 x10E6/uL (ref 4.14–5.80)
RDW: 12.7 % (ref 11.6–15.4)
WBC: 7.5 10*3/uL (ref 3.4–10.8)

## 2022-03-17 LAB — LIPID PANEL
Chol/HDL Ratio: 4.3 ratio (ref 0.0–5.0)
Cholesterol, Total: 146 mg/dL (ref 100–199)
HDL: 34 mg/dL — ABNORMAL LOW (ref >39)
LDL Chol Calc (NIH): 85 mg/dL (ref 0–99)
Triglycerides: 154 mg/dL — ABNORMAL HIGH (ref 0–149)
VLDL Cholesterol Cal: 27 mg/dL (ref 5–40)

## 2022-04-26 IMAGING — DX DG CHEST 2V
2 series · 2 of 2 positions shown · non-contrast
Comparison: No priors.

CLINICAL DATA: 65-year-old male under preoperative evaluation.

EXAM:
CHEST - 2 VIEW

[chest pa]
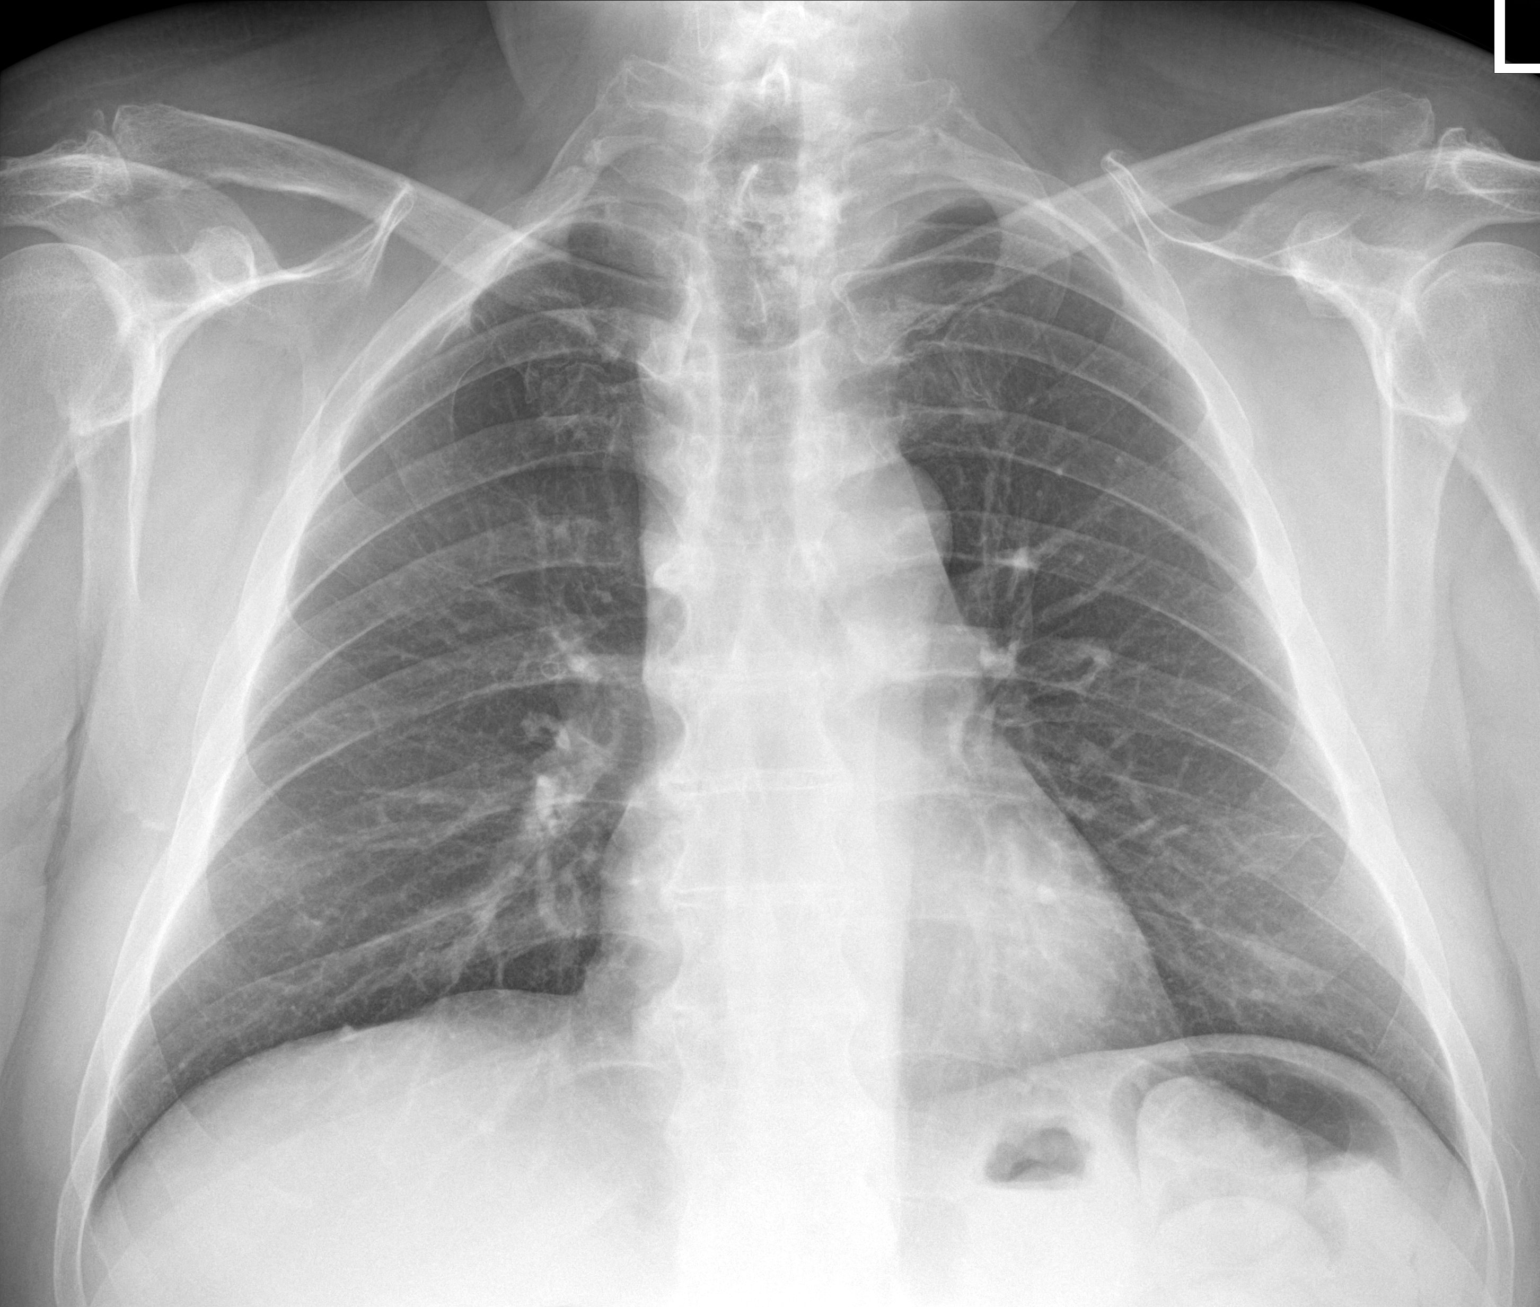

[chest lat]
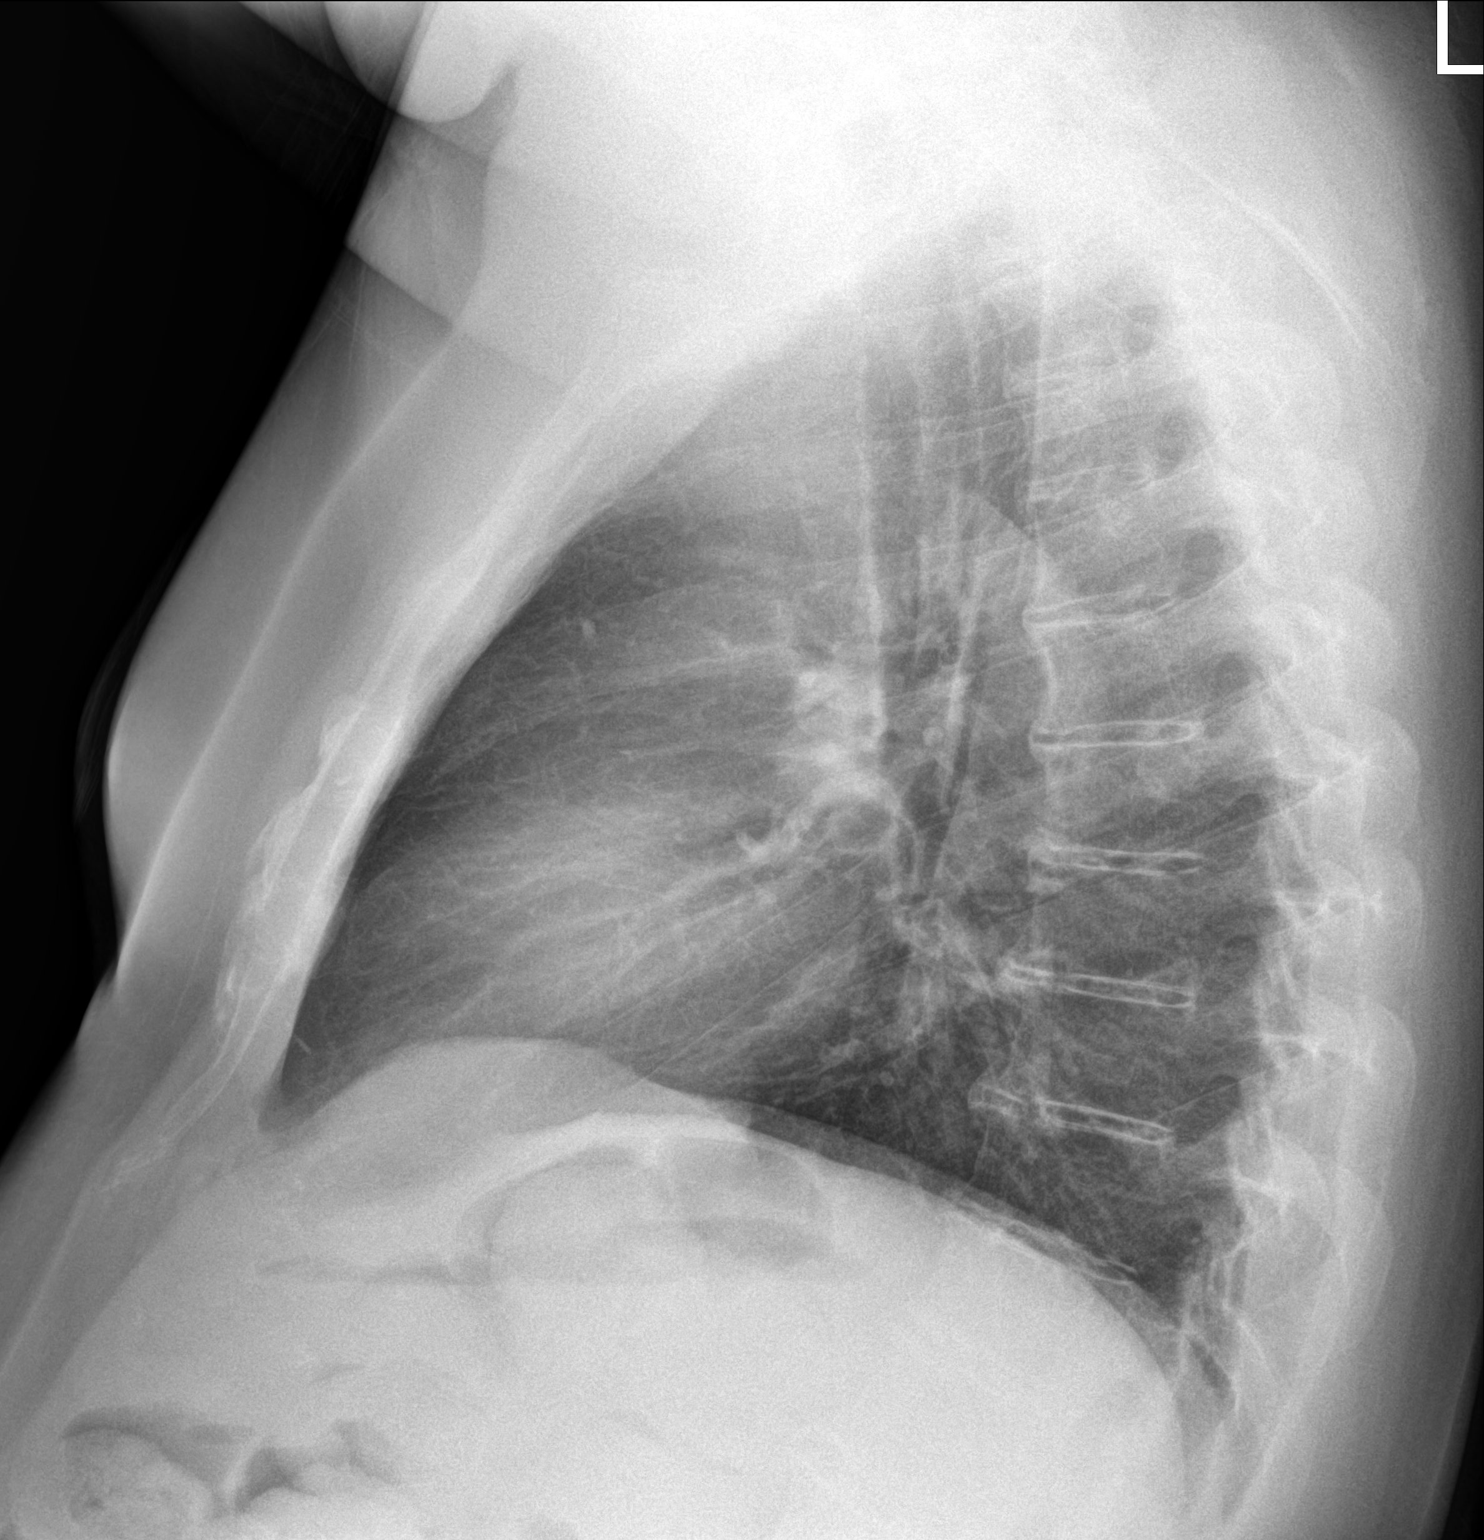

[2 of 2 positions shown; findings below may reference images not displayed]

FINDINGS: Lung volumes are normal. No consolidative airspace disease. No
pleural effusions. No pneumothorax. No pulmonary nodule or mass
noted. Pulmonary vasculature and the cardiomediastinal silhouette
are within normal limits.
IMPRESSION: No radiographic evidence of acute cardiopulmonary disease.

## 2022-06-18 ENCOUNTER — Encounter: Payer: Self-pay | Admitting: Nurse Practitioner

## 2022-06-18 ENCOUNTER — Ambulatory Visit: Payer: Medicaid Other | Admitting: Nurse Practitioner

## 2022-06-18 VITALS — BP 105/69 | HR 68 | Temp 97.2°F | Resp 20 | Ht 68.0 in | Wt 212.0 lb

## 2022-06-18 DIAGNOSIS — N521 Erectile dysfunction due to diseases classified elsewhere: Secondary | ICD-10-CM

## 2022-06-18 DIAGNOSIS — Z125 Encounter for screening for malignant neoplasm of prostate: Secondary | ICD-10-CM | POA: Diagnosis not present

## 2022-06-18 DIAGNOSIS — E1159 Type 2 diabetes mellitus with other circulatory complications: Secondary | ICD-10-CM | POA: Diagnosis not present

## 2022-06-18 DIAGNOSIS — I152 Hypertension secondary to endocrine disorders: Secondary | ICD-10-CM

## 2022-06-18 DIAGNOSIS — E119 Type 2 diabetes mellitus without complications: Secondary | ICD-10-CM

## 2022-06-18 DIAGNOSIS — E1169 Type 2 diabetes mellitus with other specified complication: Secondary | ICD-10-CM

## 2022-06-18 DIAGNOSIS — F5101 Primary insomnia: Secondary | ICD-10-CM | POA: Diagnosis not present

## 2022-06-18 DIAGNOSIS — Z6833 Body mass index (BMI) 33.0-33.9, adult: Secondary | ICD-10-CM | POA: Diagnosis not present

## 2022-06-18 DIAGNOSIS — R7989 Other specified abnormal findings of blood chemistry: Secondary | ICD-10-CM | POA: Diagnosis not present

## 2022-06-18 DIAGNOSIS — G629 Polyneuropathy, unspecified: Secondary | ICD-10-CM | POA: Diagnosis not present

## 2022-06-18 DIAGNOSIS — E785 Hyperlipidemia, unspecified: Secondary | ICD-10-CM

## 2022-06-18 LAB — BAYER DCA HB A1C WAIVED: HB A1C (BAYER DCA - WAIVED): 6.1 % — ABNORMAL HIGH (ref 4.8–5.6)

## 2022-06-18 MED ORDER — DAPAGLIFLOZIN PROPANEDIOL 10 MG PO TABS: 10.0000 mg | ORAL_TABLET | Freq: Every day | ORAL | 3 refills | Status: DC

## 2022-06-18 MED ORDER — TRULICITY 1.5 MG/0.5ML ~~LOC~~ SOAJ: 1.5000 mg | SUBCUTANEOUS | 1 refills | Status: DC

## 2022-06-18 MED ORDER — ROSUVASTATIN CALCIUM 10 MG PO TABS: 10.0000 mg | ORAL_TABLET | Freq: Every day | ORAL | 1 refills | Status: DC

## 2022-06-18 MED ORDER — SPIRONOLACTONE 25 MG PO TABS: 25.0000 mg | ORAL_TABLET | Freq: Every day | ORAL | 1 refills | Status: DC

## 2022-06-18 MED ORDER — GABAPENTIN 300 MG PO CAPS: 600.0000 mg | ORAL_CAPSULE | Freq: Three times a day (TID) | ORAL | 1 refills | Status: DC

## 2022-06-18 MED ORDER — METOPROLOL SUCCINATE ER 50 MG PO TB24: 50.0000 mg | ORAL_TABLET | Freq: Every day | ORAL | 1 refills | Status: DC

## 2022-06-18 MED ORDER — AMITRIPTYLINE HCL 150 MG PO TABS: 150.0000 mg | ORAL_TABLET | Freq: Every day | ORAL | 1 refills | Status: DC

## 2022-06-18 MED ORDER — TADALAFIL 20 MG PO TABS: 10.0000 mg | ORAL_TABLET | ORAL | 11 refills | Status: DC | PRN

## 2022-06-18 MED ORDER — GEMFIBROZIL 600 MG PO TABS: 600.0000 mg | ORAL_TABLET | Freq: Two times a day (BID) | ORAL | 1 refills | Status: DC

## 2022-06-18 NOTE — Progress Notes (Signed)
Subjective:    Patient ID: Shawn Malone, male    DOB: [birth year], 66 y.o.   MRN: [id number]   Chief Complaint: medical management of chronic issues     HPI:  Shawn Malone is a 66 y.o. who identifies as a male who was assigned male at birth.   Social history: Lives with: wife Work history: retired   Scientist, forensic in today for follow up of the following chronic medical issues:  1. Hypertension associated with diabetes ([place]) No c/o chest pain, sob or headache. Doe snot check blood pressure at home. BP Readings from Last 3 Encounters:  03/16/22 107/72  12/13/21 102/61  09/14/21 112/70     2. Hyperlipidemia associated with type 2 diabetes mellitus ([place]) Does try to watch diet but ode snot really do any exercise. Lab Results  Component Value Date   CHOL 146 03/16/2022   HDL 34 (L) 03/16/2022   LDLCALC 85 03/16/2022   TRIG 154 (H) 03/16/2022   CHOLHDL 4.3 03/16/2022     3. Type 2 diabetes mellitus without complication, without long-term current use of insulin (HCC) Does not check blood sugars daily. They have been around 110-140 m0st days when he check it.  4. Neuropathy Is on elavil and neurontin daily. Still has burning in bil feet  5. Primary insomnia Is on no prescription meds for sleep.  6. Abnormal LFTs Lab Results  Component Value Date   ALT 11 03/16/2022   AST 17 03/16/2022   ALKPHOS 62 03/16/2022   BILITOT 0.7 03/16/2022     7. BMI 33.0-33.9,adult No recent weight changes Wt Readings from Last 3 Encounters:  06/18/22 212 lb (96.2 kg)  03/16/22 210 lb (95.3 kg)  12/13/21 213 lb (96.6 kg)   BMI Readings from Last 3 Encounters:  06/18/22 32.23 kg/m  03/16/22 31.93 kg/m  12/13/21 32.39 kg/m     New complaints: None today  Allergies  Allergen Reactions   Metformin And Related     Stomach and feeling bad   Outpatient Encounter Medications as of 06/18/2022  Medication Sig   amitriptyline (ELAVIL) 150 MG tablet Take 1 tablet (150 mg total) by  mouth at bedtime.   aspirin 325 MG EC tablet Take 325 mg by mouth daily.   dapagliflozin propanediol (FARXIGA) 10 MG TABS tablet Take 1 tablet (10 mg total) by mouth daily before breakfast.   Dulaglutide (TRULICITY) 1.5 OZ/3.6UY SOPN Inject 1.5 mg into the skin once a week. (NEEDS TO BE SEEN BEFORE NEXT REFILL)   gabapentin (NEURONTIN) 300 MG capsule Take 2-3 capsules (600-900 mg total) by mouth 3 (three) times daily.   gemfibrozil (LOPID) 600 MG tablet Take 1 tablet (600 mg total) by mouth 2 (two) times daily before a meal.   metoprolol succinate (TOPROL-XL) 50 MG 24 hr tablet Take 1 tablet (50 mg total) by mouth daily. Take with or immediately following a meal.   pioglitazone (ACTOS) 45 MG tablet Take 1 tablet (45 mg total) by mouth daily.   rosuvastatin (CRESTOR) 10 MG tablet Take 1 tablet (10 mg total) by mouth daily.   sildenafil (REVATIO) 20 MG tablet Take 1-2 tablets (20-40 mg total) by mouth 3 (three) times daily.   spironolactone (ALDACTONE) 25 MG tablet Take 1 tablet (25 mg total) by mouth daily.   No facility-administered encounter medications on file as of 06/18/2022.    Past Surgical History:  Procedure Laterality Date   None     As of 08/10/16    Family History  Problem  Relation Age of Onset   Cancer Mother    Colon cancer Brother 39   Lung cancer Brother    Liver disease Neg Hx       Controlled substance contract: n/a     Review of Systems  Constitutional:  Negative for diaphoresis.  Eyes:  Negative for pain.  Respiratory:  Negative for shortness of breath.   Cardiovascular:  Negative for chest pain, palpitations and leg swelling.  Gastrointestinal:  Negative for abdominal pain.  Endocrine: Negative for polydipsia.  Skin:  Negative for rash.  Neurological:  Negative for dizziness, weakness and headaches.  Hematological:  Does not bruise/bleed easily.  All other systems reviewed and are negative.      Objective:   Physical Exam Vitals and nursing note  reviewed.  Constitutional:      Appearance: Normal appearance. He is well-developed.  HENT:     Head: Normocephalic.     Nose: Nose normal.     Mouth/Throat:     Mouth: Mucous membranes are moist.     Pharynx: Oropharynx is clear.  Eyes:     Pupils: Pupils are equal, round, and reactive to light.  Neck:     Thyroid: No thyroid mass or thyromegaly.     Vascular: No carotid bruit or JVD.     Trachea: Phonation normal.  Cardiovascular:     Rate and Rhythm: Normal rate and regular rhythm.  Pulmonary:     Effort: Pulmonary effort is normal. No respiratory distress.     Breath sounds: Normal breath sounds.  Abdominal:     General: Bowel sounds are normal.     Palpations: Abdomen is soft.     Tenderness: There is no abdominal tenderness.  Musculoskeletal:        General: Normal range of motion.     Cervical back: Normal range of motion and neck supple.  Lymphadenopathy:     Cervical: No cervical adenopathy.  Skin:    General: Skin is warm and dry.  Neurological:     Mental Status: He is alert and oriented to person, place, and time.  Psychiatric:        Behavior: Behavior normal.        Thought Content: Thought content normal.        Judgment: Judgment normal.       BP 105/69   Pulse 68   Temp (!) 97.2 F (36.2 C) (Temporal)   Resp 20   Ht _0  (1.727 m)   Wt 212 lb (96.2 kg)   SpO2 100%   BMI 32.23 kg/m   Hgba1c 6.1%    Assessment & Plan:   Shawn Malone comes in today with chief complaint of Medical Management of Chronic Issues   Diagnosis and orders addressed:  1. Hypertension associated with diabetes ([place]) Low sodium diet - CBC with Differential/Platelet - CMP14+EGFR - metoprolol succinate (TOPROL-XL) 50 MG 24 hr tablet; Take 1 tablet (50 mg total) by mouth daily. Take with or immediately following a meal.  Dispense: 90 tablet; Refill: 1 - spironolactone (ALDACTONE) 25 MG tablet; Take 1 tablet (25 mg total) by mouth daily.  Dispense: 90 tablet; Refill:  1  2. Hyperlipidemia associated with type 2 diabetes mellitus (HCC) Low fta diet - Lipid panel - gemfibrozil (LOPID) 600 MG tablet; Take 1 tablet (600 mg total) by mouth 2 (two) times daily before a meal.  Dispense: 180 tablet; Refill: 1 - rosuvastatin (CRESTOR) 10 MG tablet; Take 1 tablet (10 mg total) by mouth  daily.  Dispense: 90 tablet; Refill: 1  3. Type 2 diabetes mellitus without complication, without long-term current use of insulin (HCC) Continue to watch carbs in diet - Bayer DCA Hb A1c Waived - Microalbumin / creatinine urine ratio - dapagliflozin propanediol (FARXIGA) 10 MG TABS tablet; Take 1 tablet (10 mg total) by mouth daily before breakfast.  Dispense: 90 tablet; Refill: 3 - Dulaglutide (TRULICITY) 1.5 YC/1.4GY SOPN; Inject 1.5 mg into the skin once a week. (NEEDS TO BE SEEN BEFORE NEXT REFILL)  Dispense: 6 mL; Refill: 1  4. Neuropathy Do not go bare footed Havepartner check feel daily - amitriptyline (ELAVIL) 150 MG tablet; Take 1 tablet (150 mg total) by mouth at bedtime.  Dispense: 90 tablet; Refill: 1 - gabapentin (NEURONTIN) 300 MG capsule; Take 2-3 capsules (600-900 mg total) by mouth 3 (three) times daily.  Dispense: 240 capsule; Refill: 1  5. Primary insomnia Bedtime routine  6. Abnormal LFTs Labs pending  7. BMI 33.0-33.9,adult Discussed diet and exercise for person with BMI >25 Will recheck weight in 3-6 months  8. Screening for prostate cancer - PSA, total and free  9. Erectile dysfunction due to diseases classified elsewhere Will try cialis - tadalafil (CIALIS) 20 MG tablet; Take 0.5-1 tablets (10-20 mg total) by mouth every other day as needed for erectile dysfunction.  Dispense: 10 tablet; Refill: 11   Labs pending Health Maintenance reviewed Diet and exercise encouraged  Follow up plan: 3 months   Mary-Margaret Hassell Done, FNP

## 2022-06-18 NOTE — Patient Instructions (Signed)

## 2022-06-19 LAB — CBC WITH DIFFERENTIAL/PLATELET
Basophils Absolute: 0.1 10*3/uL (ref 0.0–0.2)
Basos: 1 %
EOS (ABSOLUTE): 0.1 10*3/uL (ref 0.0–0.4)
Eos: 2 %
Hematocrit: 43.9 % (ref 37.5–51.0)
Hemoglobin: 14.6 g/dL (ref 13.0–17.7)
Immature Grans (Abs): 0 10*3/uL (ref 0.0–0.1)
Immature Granulocytes: 0 %
Lymphocytes Absolute: 2.4 10*3/uL (ref 0.7–3.1)
Lymphs: 37 %
MCH: 31.9 pg (ref 26.6–33.0)
MCHC: 33.3 g/dL (ref 31.5–35.7)
MCV: 96 fL (ref 79–97)
Monocytes Absolute: 0.6 10*3/uL (ref 0.1–0.9)
Monocytes: 9 %
Neutrophils Absolute: 3.4 10*3/uL (ref 1.4–7.0)
Neutrophils: 51 %
Platelets: 211 10*3/uL (ref 150–450)
RBC: 4.57 x10E6/uL (ref 4.14–5.80)
RDW: 13.6 % (ref 11.6–15.4)
WBC: 6.5 10*3/uL (ref 3.4–10.8)

## 2022-06-19 LAB — CMP14+EGFR
ALT: 13 IU/L (ref 0–44)
AST: 17 IU/L (ref 0–40)
Albumin/Globulin Ratio: 1.6 (ref 1.2–2.2)
Albumin: 4 g/dL (ref 3.9–4.9)
Alkaline Phosphatase: 57 IU/L (ref 44–121)
BUN/Creatinine Ratio: 9 — ABNORMAL LOW (ref 10–24)
BUN: 13 mg/dL (ref 8–27)
Bilirubin Total: 0.5 mg/dL (ref 0.0–1.2)
CO2: 23 mmol/L (ref 20–29)
Calcium: 9.5 mg/dL (ref 8.6–10.2)
Chloride: 100 mmol/L (ref 96–106)
Creatinine, Ser: 1.42 mg/dL — ABNORMAL HIGH (ref 0.76–1.27)
Globulin, Total: 2.5 g/dL (ref 1.5–4.5)
Glucose: 103 mg/dL — ABNORMAL HIGH (ref 70–99)
Potassium: 4.4 mmol/L (ref 3.5–5.2)
Sodium: 137 mmol/L (ref 134–144)
Total Protein: 6.5 g/dL (ref 6.0–8.5)
eGFR: 55 mL/min/{1.73_m2} — ABNORMAL LOW (ref >59)

## 2022-06-19 LAB — LIPID PANEL
Chol/HDL Ratio: 4.2 ratio (ref 0.0–5.0)
Cholesterol, Total: 138 mg/dL (ref 100–199)
HDL: 33 mg/dL — ABNORMAL LOW (ref >39)
LDL Chol Calc (NIH): 77 mg/dL (ref 0–99)
Triglycerides: 158 mg/dL — ABNORMAL HIGH (ref 0–149)
VLDL Cholesterol Cal: 28 mg/dL (ref 5–40)

## 2022-06-19 LAB — PSA, TOTAL AND FREE
PSA, Free Pct: 25 %
PSA, Free: 0.4 ng/mL
Prostate Specific Ag, Serum: 1.6 ng/mL (ref 0.0–4.0)

## 2022-08-06 ENCOUNTER — Telehealth: Payer: Self-pay | Admitting: Nurse Practitioner

## 2022-08-06 NOTE — Telephone Encounter (Signed)
Medication is too expensive

## 2022-08-06 NOTE — Telephone Encounter (Signed)
No samples available. Is he ok to go without it? I am going to message Almyra Free about patient assistance. What should he do in the mean time?

## 2022-08-06 NOTE — Telephone Encounter (Signed)
No he does not need to go without it. Can we send in prescription or does insurance does not cover it.

## 2022-08-06 NOTE — Telephone Encounter (Signed)
Calling to see if we have samples of Trulicity 1.5

## 2022-08-07 NOTE — Telephone Encounter (Signed)
Samples left up front. Attempted to contact patient but her phone was not working properly and couldn't hear me. Will try to contact her at a later time

## 2022-08-07 NOTE — Telephone Encounter (Signed)
Give him rybelsus sample- 1 daily

## 2022-08-08 NOTE — Telephone Encounter (Signed)
Patient's wife notified and verbalized understanding.  

## 2022-08-28 ENCOUNTER — Telehealth: Payer: Self-pay | Admitting: Nurse Practitioner

## 2022-08-28 DIAGNOSIS — E119 Type 2 diabetes mellitus without complications: Secondary | ICD-10-CM

## 2022-08-28 MED ORDER — TRULICITY 0.75 MG/0.5ML ~~LOC~~ SOAJ: 1.5000 mg | SUBCUTANEOUS | 3 refills | Status: DC

## 2022-08-28 MED ORDER — TRULICITY 1.5 MG/0.5ML ~~LOC~~ SOAJ: 1.5000 mg | SUBCUTANEOUS | 0 refills | Status: DC

## 2022-08-28 NOTE — Addendum Note (Signed)
Addended by: Chevis Pretty on: 08/28/2022 04:42 PM   Modules accepted: Orders

## 2022-08-28 NOTE — Telephone Encounter (Signed)
  Prescription Request  08/28/2022  Is this a "Controlled Substance" medicine?   Have you seen your PCP in the last 2 weeks? Appt 2/20  If YES, route message to pool  -  If NO, patient needs to be scheduled for appointment.  What is the name of the medication or equipment? Dulaglutide (TRULICITY) 1.5 WG/6.6ZL SOPN   Have you contacted your pharmacy to request a refill? yes   Which pharmacy would you like this sent to?  [id number] - [place], [place] - [address]       Patient notified that their request is being sent to the clinical staff for review and that they should receive a response within 2 business days.

## 2022-08-28 NOTE — Telephone Encounter (Signed)
TC to make aware that I sent refill into pharmacy [place] said that Rx needed to be for the 0.75 mg for him to take 2 doses since the 1.5 mg/0.5 ml has been out of stock and that she talked to pt's PCP yesterday about this Please advise

## 2022-09-10 ENCOUNTER — Ambulatory Visit: Payer: Medicaid Other | Admitting: Family Medicine

## 2022-09-10 ENCOUNTER — Encounter: Payer: Self-pay | Admitting: Family Medicine

## 2022-09-10 DIAGNOSIS — B9689 Other specified bacterial agents as the cause of diseases classified elsewhere: Secondary | ICD-10-CM | POA: Diagnosis not present

## 2022-09-10 DIAGNOSIS — J019 Acute sinusitis, unspecified: Secondary | ICD-10-CM | POA: Diagnosis not present

## 2022-09-10 MED ORDER — AMOXICILLIN-POT CLAVULANATE 875-125 MG PO TABS: 1.0000 | ORAL_TABLET | Freq: Two times a day (BID) | ORAL | 0 refills | Status: AC

## 2022-09-10 NOTE — Progress Notes (Signed)
Telephone visit  Subjective: CC:ear pain PCP: Chevis Pretty, FNP DPO:Shawn Malone is a 67 y.o. male calls for telephone consult today. Patient provides verbal consent for consult held via phone.  Due to COVID-19 pandemic this visit was conducted virtually. This visit type was conducted due to national recommendations for restrictions regarding the COVID-19 Pandemic (e.g. social distancing, sheltering in place) in an effort to limit this patient's exposure and mitigate transmission in our community. All issues noted in this document were discussed and addressed.  A physical exam was not performed with this format.   Location of patient: home Location of provider: [facility] Others present for call: wife  1. Sinus Wife reports ear fullness, myalgia, sinus pain/fullness.  He has pus coming out of the nose.  No nausea, vomiting or diarrhea.  Home COVID test negative.  No hemoptysis/ brown sputum.  No shortness of breath, wheezing or chest pain.  He does have a cough.  Not using anything for symptoms.  Multiple sick contacts.   ROS: Per HPI  Allergies  Allergen Reactions   Metformin And Related     Stomach and feeling bad   Past Medical History:  Diagnosis Date   Diabetes mellitus without complication (HCC)    Elevated bilirubin    Extremity edema     Current Outpatient Medications:    Dulaglutide (TRULICITY) 6.14 ER/1.5QM SOPN, Inject 1.5 mg into the skin once a week., Disp: 4 mL, Rfl: 3   amitriptyline (ELAVIL) 150 MG tablet, Take 1 tablet (150 mg total) by mouth at bedtime., Disp: 90 tablet, Rfl: 1   aspirin 325 MG EC tablet, Take 325 mg by mouth daily., Disp: , Rfl:    dapagliflozin propanediol (FARXIGA) 10 MG TABS tablet, Take 1 tablet (10 mg total) by mouth daily before breakfast., Disp: 90 tablet, Rfl: 3   Dulaglutide (TRULICITY) 1.5 GQ/6.7YP SOPN, Inject 1.5 mg into the skin once a week., Disp: 6 mL, Rfl: 0   gabapentin (NEURONTIN) 300 MG capsule, Take 2-3 capsules (600-900  mg total) by mouth 3 (three) times daily., Disp: 240 capsule, Rfl: 1   gemfibrozil (LOPID) 600 MG tablet, Take 1 tablet (600 mg total) by mouth 2 (two) times daily before a meal., Disp: 180 tablet, Rfl: 1   metoprolol succinate (TOPROL-XL) 50 MG 24 hr tablet, Take 1 tablet (50 mg total) by mouth daily. Take with or immediately following a meal., Disp: 90 tablet, Rfl: 1   pioglitazone (ACTOS) 45 MG tablet, Take 1 tablet (45 mg total) by mouth daily., Disp: 90 tablet, Rfl: 1   rosuvastatin (CRESTOR) 10 MG tablet, Take 1 tablet (10 mg total) by mouth daily., Disp: 90 tablet, Rfl: 1   spironolactone (ALDACTONE) 25 MG tablet, Take 1 tablet (25 mg total) by mouth daily., Disp: 90 tablet, Rfl: 1   tadalafil (CIALIS) 20 MG tablet, Take 0.5-1 tablets (10-20 mg total) by mouth every other day as needed for erectile dysfunction., Disp: 10 tablet, Rfl: 11  Assessment/ Plan: 67 y.o. male   Acute bacterial sinusitis - Plan: amoxicillin-clavulanate (AUGMENTIN) 875-125 MG tablet  Given reports of purulent nasal discharge, will treat as bacterial sinusitis.  Though I am very suspicious that he may have flu given myalgia and other symptoms.  Declined coming in for flu testing.  Home care instructions were reviewed and reasons for reevaluation discussed.  Follow up prn.  Start time: 11:50a End time: 11:56a  Total time spent on patient care (including telephone call/ virtual visit): 6 minutes  [place],  DO [place] [phone]

## 2022-09-18 ENCOUNTER — Ambulatory Visit: Payer: Medicaid Other | Admitting: Nurse Practitioner

## 2022-09-18 ENCOUNTER — Encounter: Payer: Self-pay | Admitting: Nurse Practitioner

## 2022-09-18 VITALS — BP 102/65 | HR 82 | Temp 97.0°F | Resp 20 | Ht 68.0 in | Wt 207.0 lb

## 2022-09-18 DIAGNOSIS — G629 Polyneuropathy, unspecified: Secondary | ICD-10-CM

## 2022-09-18 DIAGNOSIS — Z6833 Body mass index (BMI) 33.0-33.9, adult: Secondary | ICD-10-CM | POA: Diagnosis not present

## 2022-09-18 DIAGNOSIS — I152 Hypertension secondary to endocrine disorders: Secondary | ICD-10-CM | POA: Diagnosis not present

## 2022-09-18 DIAGNOSIS — F5101 Primary insomnia: Secondary | ICD-10-CM

## 2022-09-18 DIAGNOSIS — E119 Type 2 diabetes mellitus without complications: Secondary | ICD-10-CM

## 2022-09-18 DIAGNOSIS — E785 Hyperlipidemia, unspecified: Secondary | ICD-10-CM | POA: Diagnosis not present

## 2022-09-18 DIAGNOSIS — E1169 Type 2 diabetes mellitus with other specified complication: Secondary | ICD-10-CM | POA: Diagnosis not present

## 2022-09-18 DIAGNOSIS — E1159 Type 2 diabetes mellitus with other circulatory complications: Secondary | ICD-10-CM

## 2022-09-18 LAB — BAYER DCA HB A1C WAIVED: HB A1C (BAYER DCA - WAIVED): 6.6 % — ABNORMAL HIGH (ref 4.8–5.6)

## 2022-09-18 MED ORDER — PIOGLITAZONE HCL 45 MG PO TABS: 45.0000 mg | ORAL_TABLET | Freq: Every day | ORAL | 1 refills | Status: DC

## 2022-09-18 NOTE — Patient Instructions (Signed)

## 2022-09-18 NOTE — Progress Notes (Signed)
Subjective:    Patient ID: Shawn Malone, male    DOB: Feb 14, 1956, 67 y.o.   MRN: [id number]   Chief Complaint: medical management of chronic issues     HPI:  Shawn Malone is a 67 y.o. who identifies as a male who was assigned male at birth.   Social history: Lives with: wife Work history: retired   Scientist, forensic in today for follow up of the following chronic medical issues:  1. Hypertension associated with diabetes ([place]) No c/o chest pain , sob or headache. Does not check blood pressure at home BP Readings from Last 3 Encounters:  06/18/22 105/69  03/16/22 107/72  12/13/21 102/61     2. Hyperlipidemia associated with type 2 diabetes mellitus ([place]) Does not really watch diet. Does very little exercise. Lab Results  Component Value Date   CHOL 138 06/18/2022   HDL 33 (L) 06/18/2022   LDLCALC 77 06/18/2022   TRIG 158 (H) 06/18/2022   CHOLHDL 4.2 06/18/2022      3. Type 2 diabetes mellitus without complication, without long-term current use of insulin ([place]) He very seldom check his blood sugars Lab Results  Component Value Date   HGBA1C 6.1 (H) 06/18/2022     4. Neuropathy Takes elavil and neurontin for burning sensation in feet  5. Primary insomnia No meds for sleep. Sleeps about6-7 hours a night  6. BMI 33.0-33.9,adult Weight is down 5 lbs  Wt Readings from Last 3 Encounters:  09/18/22 207 lb (93.9 kg)  06/18/22 212 lb (96.2 kg)  03/16/22 210 lb (95.3 kg)   BMI Readings from Last 3 Encounters:  09/18/22 31.47 kg/m  06/18/22 32.23 kg/m  03/16/22 31.93 kg/m     New complaints: None today  Allergies  Allergen Reactions   Metformin And Related     Stomach and feeling bad   Outpatient Encounter Medications as of 09/18/2022  Medication Sig   Dulaglutide (TRULICITY) A999333 0000000 SOPN Inject 1.5 mg into the skin once a week.   amitriptyline (ELAVIL) 150 MG tablet Take 1 tablet (150 mg total) by mouth at bedtime.   amoxicillin-clavulanate  (AUGMENTIN) 875-125 MG tablet Take 1 tablet by mouth 2 (two) times daily for 10 days.   aspirin 325 MG EC tablet Take 325 mg by mouth daily.   dapagliflozin propanediol (FARXIGA) 10 MG TABS tablet Take 1 tablet (10 mg total) by mouth daily before breakfast.   Dulaglutide (TRULICITY) 1.5 0000000 SOPN Inject 1.5 mg into the skin once a week.   gabapentin (NEURONTIN) 300 MG capsule Take 2-3 capsules (600-900 mg total) by mouth 3 (three) times daily.   gemfibrozil (LOPID) 600 MG tablet Take 1 tablet (600 mg total) by mouth 2 (two) times daily before a meal.   metoprolol succinate (TOPROL-XL) 50 MG 24 hr tablet Take 1 tablet (50 mg total) by mouth daily. Take with or immediately following a meal.   pioglitazone (ACTOS) 45 MG tablet Take 1 tablet (45 mg total) by mouth daily.   rosuvastatin (CRESTOR) 10 MG tablet Take 1 tablet (10 mg total) by mouth daily.   spironolactone (ALDACTONE) 25 MG tablet Take 1 tablet (25 mg total) by mouth daily.   tadalafil (CIALIS) 20 MG tablet Take 0.5-1 tablets (10-20 mg total) by mouth every other day as needed for erectile dysfunction.   No facility-administered encounter medications on file as of 09/18/2022.    Past Surgical History:  Procedure Laterality Date   None     As of 08/10/16  Family History  Problem Relation Age of Onset   Cancer Mother    Colon cancer Brother 2   Lung cancer Brother    Liver disease Neg Hx       Controlled substance contract: n/a     Review of Systems  Constitutional:  Negative for diaphoresis.  Eyes:  Negative for pain.  Respiratory:  Negative for shortness of breath.   Cardiovascular:  Negative for chest pain, palpitations and leg swelling.  Gastrointestinal:  Negative for abdominal pain.  Endocrine: Negative for polydipsia.  Skin:  Negative for rash.  Neurological:  Negative for dizziness, weakness and headaches.  Hematological:  Does not bruise/bleed easily.  All other systems reviewed and are  negative.      Objective:   Physical Exam Vitals and nursing note reviewed.  Constitutional:      Appearance: Normal appearance. He is well-developed.  HENT:     Head: Normocephalic.     Nose: Nose normal.     Mouth/Throat:     Mouth: Mucous membranes are moist.     Pharynx: Oropharynx is clear.  Eyes:     Pupils: Pupils are equal, round, and reactive to light.  Neck:     Thyroid: No thyroid mass or thyromegaly.     Vascular: No carotid bruit or JVD.     Trachea: Phonation normal.  Cardiovascular:     Rate and Rhythm: Normal rate and regular rhythm.  Pulmonary:     Effort: Pulmonary effort is normal. No respiratory distress.     Breath sounds: Normal breath sounds.  Abdominal:     General: Bowel sounds are normal.     Palpations: Abdomen is soft.     Tenderness: There is no abdominal tenderness.  Musculoskeletal:        General: Normal range of motion.     Cervical back: Normal range of motion and neck supple.  Lymphadenopathy:     Cervical: No cervical adenopathy.  Skin:    General: Skin is warm and dry.  Neurological:     Mental Status: He is alert and oriented to person, place, and time.  Psychiatric:        Behavior: Behavior normal.        Thought Content: Thought content normal.        Judgment: Judgment normal.    BP 102/65   Pulse 82   Temp (!) 97 F (36.1 C) (Temporal)   Resp 20   Ht 5' 8"$  (1.727 m)   Wt 207 lb (93.9 kg)   SpO2 96%   BMI 31.47 kg/m   Hgba1c 6.6%       Assessment & Plan:   Shawn Malone comes in today with chief complaint of Medical Management of Chronic Issues   Diagnosis and orders addressed:  1. Hypertension associated with diabetes ([place]) Low sodium diet - CBC with Differential/Platelet - CMP14+EGFR  2. Hyperlipidemia associated with type 2 diabetes mellitus (HCC) Low fat diet - Lipid panel  3. Type 2 diabetes mellitus without complication, without long-term current use of insulin (HCC) Continue to watch carbs in  diet - Bayer DCA Hb A1c Waived - Microalbumin / creatinine urine ratio - pioglitazone (ACTOS) 45 MG tablet; Take 1 tablet (45 mg total) by mouth daily.  Dispense: 90 tablet; Refill: 1  4. Neuropathy Do not go barefooted  5. Primary insomnia Bedtime routine  6. BMI 33.0-33.9,adult Discussed diet and exercise for person with BMI >25 Will recheck weight in 3-6 months  Labs pending Health Maintenance reviewed Diet and exercise encouraged  Follow up plan: 38month   Mary-Margaret MHassell Done FNP

## 2022-09-19 LAB — CMP14+EGFR
ALT: 16 IU/L (ref 0–44)
AST: 14 IU/L (ref 0–40)
Albumin/Globulin Ratio: 1.9 (ref 1.2–2.2)
Albumin: 4.3 g/dL (ref 3.9–4.9)
Alkaline Phosphatase: 61 IU/L (ref 44–121)
BUN/Creatinine Ratio: 12 (ref 10–24)
BUN: 14 mg/dL (ref 8–27)
Bilirubin Total: 0.5 mg/dL (ref 0.0–1.2)
CO2: 25 mmol/L (ref 20–29)
Calcium: 9.4 mg/dL (ref 8.6–10.2)
Chloride: 96 mmol/L (ref 96–106)
Creatinine, Ser: 1.21 mg/dL (ref 0.76–1.27)
Globulin, Total: 2.3 g/dL (ref 1.5–4.5)
Glucose: 104 mg/dL — ABNORMAL HIGH (ref 70–99)
Potassium: 5 mmol/L (ref 3.5–5.2)
Sodium: 134 mmol/L (ref 134–144)
Total Protein: 6.6 g/dL (ref 6.0–8.5)
eGFR: 67 mL/min/{1.73_m2} (ref >59)

## 2022-09-19 LAB — CBC WITH DIFFERENTIAL/PLATELET
Basophils Absolute: 0.1 10*3/uL (ref 0.0–0.2)
Basos: 1 %
EOS (ABSOLUTE): 0.1 10*3/uL (ref 0.0–0.4)
Eos: 2 %
Hematocrit: 42.5 % (ref 37.5–51.0)
Hemoglobin: 14.2 g/dL (ref 13.0–17.7)
Immature Grans (Abs): 0 10*3/uL (ref 0.0–0.1)
Immature Granulocytes: 1 %
Lymphocytes Absolute: 2.9 10*3/uL (ref 0.7–3.1)
Lymphs: 38 %
MCH: 31.3 pg (ref 26.6–33.0)
MCHC: 33.4 g/dL (ref 31.5–35.7)
MCV: 94 fL (ref 79–97)
Monocytes Absolute: 0.7 10*3/uL (ref 0.1–0.9)
Monocytes: 9 %
Neutrophils Absolute: 3.7 10*3/uL (ref 1.4–7.0)
Neutrophils: 49 %
Platelets: 253 10*3/uL (ref 150–450)
RBC: 4.54 x10E6/uL (ref 4.14–5.80)
RDW: 13 % (ref 11.6–15.4)
WBC: 7.5 10*3/uL (ref 3.4–10.8)

## 2022-09-19 LAB — LIPID PANEL
Chol/HDL Ratio: 4.5 ratio (ref 0.0–5.0)
Cholesterol, Total: 121 mg/dL (ref 100–199)
HDL: 27 mg/dL — ABNORMAL LOW (ref >39)
LDL Chol Calc (NIH): 66 mg/dL (ref 0–99)
Triglycerides: 164 mg/dL — ABNORMAL HIGH (ref 0–149)
VLDL Cholesterol Cal: 28 mg/dL (ref 5–40)

## 2022-09-19 LAB — MICROALBUMIN / CREATININE URINE RATIO
Creatinine, Urine: 53.2 mg/dL
Microalb/Creat Ratio: <6 mg/g creat (ref 0–29)
Microalbumin, Urine: <3 ug/mL

## 2022-12-17 ENCOUNTER — Encounter: Payer: Self-pay | Admitting: Nurse Practitioner

## 2022-12-17 ENCOUNTER — Ambulatory Visit (INDEPENDENT_AMBULATORY_CARE_PROVIDER_SITE_OTHER): Payer: Medicare Other | Admitting: Nurse Practitioner

## 2022-12-17 VITALS — BP 115/75 | HR 94 | Temp 97.4°F | Resp 20 | Ht 68.0 in | Wt 206.0 lb

## 2022-12-17 DIAGNOSIS — G629 Polyneuropathy, unspecified: Secondary | ICD-10-CM

## 2022-12-17 DIAGNOSIS — E119 Type 2 diabetes mellitus without complications: Secondary | ICD-10-CM | POA: Diagnosis not present

## 2022-12-17 DIAGNOSIS — F5101 Primary insomnia: Secondary | ICD-10-CM

## 2022-12-17 DIAGNOSIS — Z7984 Long term (current) use of oral hypoglycemic drugs: Secondary | ICD-10-CM

## 2022-12-17 DIAGNOSIS — E1159 Type 2 diabetes mellitus with other circulatory complications: Secondary | ICD-10-CM

## 2022-12-17 DIAGNOSIS — Z6833 Body mass index (BMI) 33.0-33.9, adult: Secondary | ICD-10-CM

## 2022-12-17 DIAGNOSIS — E1169 Type 2 diabetes mellitus with other specified complication: Secondary | ICD-10-CM

## 2022-12-17 DIAGNOSIS — I152 Hypertension secondary to endocrine disorders: Secondary | ICD-10-CM

## 2022-12-17 DIAGNOSIS — E785 Hyperlipidemia, unspecified: Secondary | ICD-10-CM

## 2022-12-17 MED ORDER — METOPROLOL SUCCINATE ER 50 MG PO TB24
50.0000 mg | ORAL_TABLET | Freq: Every day | ORAL | 1 refills | Status: DC
Start: 2022-12-17 — End: 2023-06-20

## 2022-12-17 MED ORDER — PIOGLITAZONE HCL 45 MG PO TABS
45.0000 mg | ORAL_TABLET | Freq: Every day | ORAL | 1 refills | Status: DC
Start: 2022-12-17 — End: 2023-05-13

## 2022-12-17 MED ORDER — TRULICITY 1.5 MG/0.5ML ~~LOC~~ SOAJ
1.5000 mg | SUBCUTANEOUS | 3 refills | Status: DC
Start: 2022-12-17 — End: 2023-06-20

## 2022-12-17 MED ORDER — DAPAGLIFLOZIN PROPANEDIOL 10 MG PO TABS
10.0000 mg | ORAL_TABLET | Freq: Every day | ORAL | 3 refills | Status: DC
Start: 2022-12-17 — End: 2023-06-20

## 2022-12-17 MED ORDER — GEMFIBROZIL 600 MG PO TABS
600.0000 mg | ORAL_TABLET | Freq: Two times a day (BID) | ORAL | 1 refills | Status: DC
Start: 2022-12-17 — End: 2023-06-20

## 2022-12-17 MED ORDER — AMITRIPTYLINE HCL 150 MG PO TABS
150.0000 mg | ORAL_TABLET | Freq: Every day | ORAL | 1 refills | Status: DC
Start: 2022-12-17 — End: 2023-05-09

## 2022-12-17 MED ORDER — ROSUVASTATIN CALCIUM 10 MG PO TABS: 10.0000 mg | ORAL_TABLET | Freq: Every day | ORAL | 1 refills | Status: DC

## 2022-12-17 MED ORDER — GABAPENTIN 300 MG PO CAPS
600.0000 mg | ORAL_CAPSULE | Freq: Three times a day (TID) | ORAL | 1 refills | Status: DC
Start: 2022-12-17 — End: 2023-05-09

## 2022-12-17 MED ORDER — SPIRONOLACTONE 25 MG PO TABS
25.0000 mg | ORAL_TABLET | Freq: Every day | ORAL | 1 refills | Status: DC
Start: 2022-12-17 — End: 2023-06-20

## 2022-12-17 NOTE — Patient Instructions (Signed)

## 2022-12-17 NOTE — Progress Notes (Signed)
Subjective:    Patient ID: Shawn Malone, male    DOB: [birth year], 67 y.o.   MRN: [id number]   Chief Complaint: medical management of chronic issues     HPI:  Shawn Malone is a 67 y.o. who identifies as a male who was assigned male at birth.   Social history: Lives with: girlfriend Work history: disability   Comes in today for follow up of the following chronic medical issues:  1. Hyperlipidemia associated with type 2 diabetes mellitus (HCC) Does not watch diet and does no exercise. Lab Results  Component Value Date   CHOL 121 09/18/2022   HDL 27 (L) 09/18/2022   LDLCALC 66 09/18/2022   TRIG 164 (H) 09/18/2022   CHOLHDL 4.5 09/18/2022     2. Type 2 diabetes mellitus without complication, without long-term current use of insulin (HCC) Fasting blood sugars are running around 130-150. Denies any  low blood sugars Lab Results  Component Value Date   HGBA1C 6.6 (H) 09/18/2022     3. Hypertension associated with diabetes (HCC) No c/o chest pain sob or headache. Does not check blood sugars at home BP Readings from Last 3 Encounters:  09/18/22 102/65  06/18/22 105/69  03/16/22 107/72     4. Neuropathy Is on neurontin which helps. Takes elavil as well. Rates pain 4-5/10 today.  5. Primary insomnia Hs been sleeping well  6. BMI 33.0-33.9,adult no recent weight changes Wt Readings from Last 3 Encounters:  12/17/22 206 lb (93.4 kg)  09/18/22 207 lb (93.9 kg)  06/18/22 212 lb (96.2 kg)   BMI Readings from Last 3 Encounters:  12/17/22 31.32 kg/m  09/18/22 31.47 kg/m  06/18/22 32.23 kg/m      New complaints: None today  Allergies  Allergen Reactions   Metformin And Related     Stomach and feeling bad   Outpatient Encounter Medications as of 12/17/2022  Medication Sig   amitriptyline (ELAVIL) 150 MG tablet Take 1 tablet (150 mg total) by mouth at bedtime.   aspirin 325 MG EC tablet Take 325 mg by mouth daily.   dapagliflozin propanediol (FARXIGA) 10  MG TABS tablet Take 1 tablet (10 mg total) by mouth daily before breakfast.   Dulaglutide (TRULICITY) 0.75 MG/0.5ML SOPN Inject 1.5 mg into the skin once a week.   Dulaglutide (TRULICITY) 1.5 MG/0.5ML SOPN Inject 1.5 mg into the skin once a week.   gabapentin (NEURONTIN) 300 MG capsule Take 2-3 capsules (600-900 mg total) by mouth 3 (three) times daily.   gemfibrozil (LOPID) 600 MG tablet Take 1 tablet (600 mg total) by mouth 2 (two) times daily before a meal.   metoprolol succinate (TOPROL-XL) 50 MG 24 hr tablet Take 1 tablet (50 mg total) by mouth daily. Take with or immediately following a meal.   pioglitazone (ACTOS) 45 MG tablet Take 1 tablet (45 mg total) by mouth daily.   rosuvastatin (CRESTOR) 10 MG tablet Take 1 tablet (10 mg total) by mouth daily.   spironolactone (ALDACTONE) 25 MG tablet Take 1 tablet (25 mg total) by mouth daily.   tadalafil (CIALIS) 20 MG tablet Take 0.5-1 tablets (10-20 mg total) by mouth every other day as needed for erectile dysfunction.   No facility-administered encounter medications on file as of 12/17/2022.    Past Surgical History:  Procedure Laterality Date   None     As of 08/10/16    Family History  Problem Relation Age of Onset   Cancer Mother    Colon cancer Brother  48   Lung cancer Brother    Liver disease Neg Hx       Controlled substance contract: n/a     Review of Systems  Constitutional:  Negative for diaphoresis.  Eyes:  Negative for pain.  Respiratory:  Negative for shortness of breath.   Cardiovascular:  Negative for chest pain, palpitations and leg swelling.  Gastrointestinal:  Negative for abdominal pain.  Endocrine: Negative for polydipsia.  Skin:  Negative for rash.  Neurological:  Negative for dizziness, weakness and headaches.  Hematological:  Does not bruise/bleed easily.  All other systems reviewed and are negative.      Objective:   Physical Exam Vitals and nursing note reviewed.  Constitutional:       Appearance: Normal appearance. He is well-developed.  HENT:     Head: Normocephalic.     Nose: Nose normal.     Mouth/Throat:     Mouth: Mucous membranes are moist.     Pharynx: Oropharynx is clear.  Eyes:     Pupils: Pupils are equal, round, and reactive to light.  Neck:     Thyroid: No thyroid mass or thyromegaly.     Vascular: No carotid bruit or JVD.     Trachea: Phonation normal.  Cardiovascular:     Rate and Rhythm: Normal rate and regular rhythm.  Pulmonary:     Effort: Pulmonary effort is normal. No respiratory distress.     Breath sounds: Normal breath sounds.  Abdominal:     General: Bowel sounds are normal.     Palpations: Abdomen is soft.     Tenderness: There is no abdominal tenderness.  Musculoskeletal:        General: Normal range of motion.     Cervical back: Normal range of motion and neck supple.  Lymphadenopathy:     Cervical: No cervical adenopathy.  Skin:    General: Skin is warm and dry.  Neurological:     Mental Status: He is alert and oriented to person, place, and time.  Psychiatric:        Behavior: Behavior normal.        Thought Content: Thought content normal.        Judgment: Judgment normal.     BP 115/75   Pulse 94   Temp (!) 97.4 F (36.3 C) (Temporal)   Resp 20   Ht 5\' 8"  (1.727 m)   Wt 206 lb (93.4 kg)   SpO2 96%   BMI 31.32 kg/m   Hgba1c 5.8%      Assessment & Plan:   Olie Lizak comes in today with chief complaint of Medical Management of Chronic Issues   Diagnosis and orders addressed:  1. Hyperlipidemia associated with type 2 diabetes mellitus (HCC) Low fat diet - Lipid panel - gemfibrozil (LOPID) 600 MG tablet; Take 1 tablet (600 mg total) by mouth 2 (two) times daily before a meal.  Dispense: 180 tablet; Refill: 1 - rosuvastatin (CRESTOR) 10 MG tablet; Take 1 tablet (10 mg total) by mouth daily.  Dispense: 90 tablet; Refill: 1  2. Type 2 diabetes mellitus without complication, without long-term current use of  insulin (HCC) Continue to watch carbs - Bayer DCA Hb A1c Waived - dapagliflozin propanediol (FARXIGA) 10 MG TABS tablet; Take 1 tablet (10 mg total) by mouth daily before breakfast.  Dispense: 90 tablet; Refill: 3 - Dulaglutide (TRULICITY) 1.5 MG/0.5ML SOPN; Inject 1.5 mg into the skin once a week.  Dispense: 6 mL; Refill: 3 - pioglitazone (ACTOS) 45  MG tablet; Take 1 tablet (45 mg total) by mouth daily.  Dispense: 90 tablet; Refill: 1  3. Hypertension associated with diabetes (HCC) Low sodium diet - CBC with Differential/Platelet - CMP14+EGFR - metoprolol succinate (TOPROL-XL) 50 MG 24 hr tablet; Take 1 tablet (50 mg total) by mouth daily. Take with or immediately following a meal.  Dispense: 90 tablet; Refill: 1 - spironolactone (ALDACTONE) 25 MG tablet; Take 1 tablet (25 mg total) by mouth daily.  Dispense: 90 tablet; Refill: 1  4. Neuropathy - amitriptyline (ELAVIL) 150 MG tablet; Take 1 tablet (150 mg total) by mouth at bedtime.  Dispense: 90 tablet; Refill: 1 - gabapentin (NEURONTIN) 300 MG capsule; Take 2-3 capsules (600-900 mg total) by mouth 3 (three) times daily.  Dispense: 240 capsule; Refill: 1  5. Primary insomnia Bedtime routine  6. BMI 33.0-33.9,adult Discussed diet and exercise for person with BMI >25 Will recheck weight in 3-6 months    Labs pending Health Maintenance reviewed Diet and exercise encouraged  Follow up plan: 3 months   Mary-Margaret Daphine Deutscher, FNP

## 2022-12-18 LAB — CBC WITH DIFFERENTIAL/PLATELET
Basophils Absolute: 0.1 10*3/uL (ref 0.0–0.2)
Basos: 1 %
EOS (ABSOLUTE): 0.1 10*3/uL (ref 0.0–0.4)
Eos: 1 %
Hematocrit: 43.3 % (ref 37.5–51.0)
Hemoglobin: 14.4 g/dL (ref 13.0–17.7)
Immature Grans (Abs): 0 10*3/uL (ref 0.0–0.1)
Immature Granulocytes: 0 %
Lymphocytes Absolute: 2 10*3/uL (ref 0.7–3.1)
Lymphs: 24 %
MCH: 32.2 pg (ref 26.6–33.0)
MCHC: 33.3 g/dL (ref 31.5–35.7)
MCV: 97 fL (ref 79–97)
Monocytes Absolute: 0.8 10*3/uL (ref 0.1–0.9)
Monocytes: 10 %
Neutrophils Absolute: 5.4 10*3/uL (ref 1.4–7.0)
Neutrophils: 64 %
Platelets: 223 10*3/uL (ref 150–450)
RBC: 4.47 x10E6/uL (ref 4.14–5.80)
RDW: 13.5 % (ref 11.6–15.4)
WBC: 8.3 10*3/uL (ref 3.4–10.8)

## 2022-12-18 LAB — LIPID PANEL
Chol/HDL Ratio: 4.7 ratio (ref 0.0–5.0)
Cholesterol, Total: 160 mg/dL (ref 100–199)
HDL: 34 mg/dL — ABNORMAL LOW (ref >39)
LDL Chol Calc (NIH): 96 mg/dL (ref 0–99)
Triglycerides: 169 mg/dL — ABNORMAL HIGH (ref 0–149)
VLDL Cholesterol Cal: 30 mg/dL (ref 5–40)

## 2022-12-18 LAB — CMP14+EGFR
ALT: 22 IU/L (ref 0–44)
AST: 23 IU/L (ref 0–40)
Albumin/Globulin Ratio: 1.7 (ref 1.2–2.2)
Albumin: 4.2 g/dL (ref 3.9–4.9)
Alkaline Phosphatase: 64 IU/L (ref 44–121)
BUN/Creatinine Ratio: 12 (ref 10–24)
BUN: 15 mg/dL (ref 8–27)
Bilirubin Total: 0.6 mg/dL (ref 0.0–1.2)
CO2: 24 mmol/L (ref 20–29)
Calcium: 9.7 mg/dL (ref 8.6–10.2)
Chloride: 99 mmol/L (ref 96–106)
Creatinine, Ser: 1.26 mg/dL (ref 0.76–1.27)
Globulin, Total: 2.5 g/dL (ref 1.5–4.5)
Glucose: 102 mg/dL — ABNORMAL HIGH (ref 70–99)
Potassium: 4.6 mmol/L (ref 3.5–5.2)
Sodium: 136 mmol/L (ref 134–144)
Total Protein: 6.7 g/dL (ref 6.0–8.5)
eGFR: 63 mL/min/{1.73_m2} (ref >59)

## 2022-12-18 LAB — BAYER DCA HB A1C WAIVED: HB A1C (BAYER DCA - WAIVED): 5.8 % — ABNORMAL HIGH (ref 4.8–5.6)

## 2023-03-18 ENCOUNTER — Ambulatory Visit (INDEPENDENT_AMBULATORY_CARE_PROVIDER_SITE_OTHER): Payer: Medicare Other | Admitting: Nurse Practitioner

## 2023-03-18 ENCOUNTER — Encounter: Payer: Self-pay | Admitting: Nurse Practitioner

## 2023-03-18 VITALS — BP 106/67 | HR 93 | Temp 96.9°F | Resp 20 | Ht 68.0 in | Wt 208.0 lb

## 2023-03-18 DIAGNOSIS — E1169 Type 2 diabetes mellitus with other specified complication: Secondary | ICD-10-CM

## 2023-03-18 DIAGNOSIS — E1159 Type 2 diabetes mellitus with other circulatory complications: Secondary | ICD-10-CM

## 2023-03-18 DIAGNOSIS — I152 Hypertension secondary to endocrine disorders: Secondary | ICD-10-CM

## 2023-03-18 DIAGNOSIS — E119 Type 2 diabetes mellitus without complications: Secondary | ICD-10-CM

## 2023-03-18 DIAGNOSIS — H6122 Impacted cerumen, left ear: Secondary | ICD-10-CM

## 2023-03-18 DIAGNOSIS — E785 Hyperlipidemia, unspecified: Secondary | ICD-10-CM

## 2023-03-18 DIAGNOSIS — Z6833 Body mass index (BMI) 33.0-33.9, adult: Secondary | ICD-10-CM

## 2023-03-18 DIAGNOSIS — G629 Polyneuropathy, unspecified: Secondary | ICD-10-CM

## 2023-03-18 DIAGNOSIS — F5101 Primary insomnia: Secondary | ICD-10-CM

## 2023-03-18 DIAGNOSIS — Z7985 Long-term (current) use of injectable non-insulin antidiabetic drugs: Secondary | ICD-10-CM

## 2023-03-18 DIAGNOSIS — Z7984 Long term (current) use of oral hypoglycemic drugs: Secondary | ICD-10-CM

## 2023-03-18 LAB — BAYER DCA HB A1C WAIVED: HB A1C (BAYER DCA - WAIVED): 5.7 % — ABNORMAL HIGH (ref 4.8–5.6)

## 2023-03-18 NOTE — Progress Notes (Signed)
Subjective:    Patient ID: Shawn Malone, male    DOB: Jan 13, 1956, 67 y.o.   MRN: [id number]   Chief Complaint: medical management of chronic issues     HPI:  Shawn Malone is a 67 y.o. who identifies as a male who was assigned male at birth.   Social history: Lives with: wife Work history: disability   Comes in today for follow up of the following chronic medical issues:  1. Hypertension associated with diabetes (HCC) No c/o chest pain, sob or headache. Does not check blood pressure at home. BP Readings from Last 3 Encounters:  12/17/22 115/75  09/18/22 102/65  06/18/22 105/69     2. Hyperlipidemia associated with type 2 diabetes mellitus (HCC) Does not really watch diet and does no dedicated exercise. Lab Results  Component Value Date   CHOL 160 12/17/2022   HDL 34 (L) 12/17/2022   LDLCALC 96 12/17/2022   TRIG 169 (H) 12/17/2022   CHOLHDL 4.7 12/17/2022     3. Diabetes mellitus treated with oral medication (HCC) 4. Long-term current use of injectable noninsulin antidiabetic medication Fasting blood sugars have been running around 130-150. Denies any  low blood sugars. Lab Results  Component Value Date   HGBA1C 5.8 (H) 12/17/2022     5. Neuropathy Has numbness in bil feet and toes.   6. Primary insomnia Is currently on no sleep aids  7. BMI 33.0-33.9,adult No recent weight changes Wt Readings from Last 3 Encounters:  03/18/23 208 lb (94.3 kg)  12/17/22 206 lb (93.4 kg)  09/18/22 207 lb (93.9 kg)   BMI Readings from Last 3 Encounters:  03/18/23 31.63 kg/m  12/17/22 31.32 kg/m  09/18/22 31.47 kg/m      New complaints: None today  Allergies  Allergen Reactions   Metformin And Related     Stomach and feeling bad   Outpatient Encounter Medications as of 03/18/2023  Medication Sig   amitriptyline (ELAVIL) 150 MG tablet Take 1 tablet (150 mg total) by mouth at bedtime.   aspirin 325 MG EC tablet Take 325 mg by mouth daily.   dapagliflozin  propanediol (FARXIGA) 10 MG TABS tablet Take 1 tablet (10 mg total) by mouth daily before breakfast.   Dulaglutide (TRULICITY) 1.5 MG/0.5ML SOPN Inject 1.5 mg into the skin once a week.   gabapentin (NEURONTIN) 300 MG capsule Take 2-3 capsules (600-900 mg total) by mouth 3 (three) times daily.   gemfibrozil (LOPID) 600 MG tablet Take 1 tablet (600 mg total) by mouth 2 (two) times daily before a meal.   metoprolol succinate (TOPROL-XL) 50 MG 24 hr tablet Take 1 tablet (50 mg total) by mouth daily. Take with or immediately following a meal.   pioglitazone (ACTOS) 45 MG tablet Take 1 tablet (45 mg total) by mouth daily.   rosuvastatin (CRESTOR) 10 MG tablet Take 1 tablet (10 mg total) by mouth daily.   spironolactone (ALDACTONE) 25 MG tablet Take 1 tablet (25 mg total) by mouth daily.   tadalafil (CIALIS) 20 MG tablet Take 0.5-1 tablets (10-20 mg total) by mouth every other day as needed for erectile dysfunction.   No facility-administered encounter medications on file as of 03/18/2023.    Past Surgical History:  Procedure Laterality Date   None     As of 08/10/16    Family History  Problem Relation Age of Onset   Cancer Mother    Colon cancer Brother 49   Lung cancer Brother    Liver disease Neg Hx  Controlled substance contract: n/a     Review of Systems  Constitutional:  Negative for diaphoresis.  Eyes:  Negative for pain.  Respiratory:  Negative for shortness of breath.   Cardiovascular:  Negative for chest pain, palpitations and leg swelling.  Gastrointestinal:  Negative for abdominal pain.  Endocrine: Negative for polydipsia.  Skin:  Negative for rash.  Neurological:  Negative for dizziness, weakness and headaches.  Hematological:  Does not bruise/bleed easily.  All other systems reviewed and are negative.      Objective:   Physical Exam Vitals and nursing note reviewed.  Constitutional:      Appearance: Normal appearance. He is well-developed.  HENT:      Head: Normocephalic.     Right Ear: Tympanic membrane normal. There is no impacted cerumen.     Left Ear: There is impacted cerumen.     Nose: Nose normal.     Mouth/Throat:     Mouth: Mucous membranes are moist.     Pharynx: Oropharynx is clear.  Eyes:     Pupils: Pupils are equal, round, and reactive to light.  Neck:     Thyroid: No thyroid mass or thyromegaly.     Vascular: No carotid bruit or JVD.     Trachea: Phonation normal.  Cardiovascular:     Rate and Rhythm: Normal rate and regular rhythm.  Pulmonary:     Effort: Pulmonary effort is normal. No respiratory distress.     Breath sounds: Normal breath sounds.  Abdominal:     General: Bowel sounds are normal.     Palpations: Abdomen is soft.     Tenderness: There is no abdominal tenderness.  Musculoskeletal:        General: Normal range of motion.     Cervical back: Normal range of motion and neck supple.  Lymphadenopathy:     Cervical: No cervical adenopathy.  Skin:    General: Skin is warm and dry.  Neurological:     Mental Status: He is alert and oriented to person, place, and time.  Psychiatric:        Behavior: Behavior normal.        Thought Content: Thought content normal.        Judgment: Judgment normal.    BP 106/67   Pulse 93   Temp (!) 96.9 F (36.1 C) (Temporal)   Resp 20   Ht 5\' 8"  (1.727 m)   Wt 208 lb (94.3 kg)   SpO2 98%   BMI 31.63 kg/m   HGBA1c 5.7% S/P left ear irrigation- TN clear     Assessment & Plan:  Shawn Malone in today with chief complaint of No chief complaint on file.   1. Hypertension associated with diabetes (HCC) Low sodium diet - CBC with Differential/Platelet - CMP14+EGFR  2. Hyperlipidemia associated with type 2 diabetes mellitus (HCC) Low fat diet - Lipid panel  3. Diabetes mellitus treated with oral medication (HCC) Continue to watch carbs in diet - Bayer DCA Hb A1c Waived  4. Long-term current use of injectable noninsulin antidiabetic medication  5.  Neuropathy Do not go barefooted Check feet daily  6. Primary insomnia Bedtime routine  7. BMI 33.0-33.9,adult Discussed diet and exercise for person with BMI >25 Will recheck weight in 3-6 months   8. Impacted cerumen of left ear Debrox 2-3 x a week Will see audiologist if hearing does not improve.    The above assessment and management plan was discussed with the patient. The patient verbalized understanding  of and has agreed to the management plan. Patient is aware to call the clinic if symptoms persist or worsen. Patient is aware when to return to the clinic for a follow-up visit. Patient educated on when it is appropriate to go to the emergency department.   Mary-Margaret Daphine Deutscher, FNP

## 2023-03-18 NOTE — Patient Instructions (Signed)
 Earwax Buildup, Adult Your ears make something called earwax. It helps keep germs called bacteria away and protects the skin in your ears. Sometimes, too much earwax can build up. This can cause discomfort or make it harder to hear. What are the causes? Earwax buildup can happen when you have too much earwax in your ears. Earwax is made in the outer part of your ear canal. It's supposed to fall out in small amounts over time. But if your ears aren't able to clean themselves like they should, earwax can build up. What increases the risk? You're more likely to get earwax buildup if: You clean your ears with cotton swabs. You pick at your ears. You use earplugs or in-ear headphones a lot. You wear hearing aids. You may also be more likely to get it if: You're male. You're older. Your ears naturally make more earwax. You have narrow ear canals or extra hair in your ears. Your earwax is too thick or sticky. You have eczema. You're dehydrated. This means there's not enough fluid in your body. What are the signs or symptoms? Symptoms of earwax buildup include: Not being able to hear as well. A feeling of fullness in your ear. Feeling like your ear is plugged. Fluid coming from your ear. Ear pain or an itchy ear. Ringing in your ear. Coughing or problems with balance. How is this diagnosed? Earwax buildup may be diagnosed based on your symptoms, medical history, and an ear exam. During the exam, your health care provider will look into your ear with a tool called an otoscope. You may also have tests, such as a hearing test. How is this treated? Earwax buildup may be treated by: Using ear drops. Having the earwax removed by a provider. The provider may: Flush the ear with water. Use a tool called a curette that has a loop on the end. Use a suction device. Having surgery. This may be done in severe cases. Follow these instructions at home:  Cleaning your ears Clean your ears as told  by your provider. You can clean the outside of your ears with a washcloth or tissue. Do not overclean your ears. Do not put anything into your ear unless told. This includes cotton swabs. General instructions Take over-the-counter and prescription medicines only as told by your provider. Drink enough fluid to keep your pee (urine) pale yellow. This helps thin the earwax. If you have hearing aids, clean them as told. Keep all follow-up visits. If earwax builds up in your ears often or if you use hearing aids, ask your provider how often you should have your ears cleaned. Contact a health care provider if: Your ear pain gets worse. You have a fever. You have pus, blood, or other fluid coming from your ear. You have hearing loss. You have ringing in your ears that won't go away. You feel like the room is spinning. This is called vertigo. Your symptoms don't get better with treatment. This information is not intended to replace advice given to you by your health care provider. Make sure you discuss any questions you have with your health care provider. Document Revised: 09/27/2022 Document Reviewed: 09/27/2022 Elsevier Patient Education  2024 [employer].

## 2023-03-19 LAB — CMP14+EGFR
ALT: 18 IU/L (ref 0–44)
AST: 23 IU/L (ref 0–40)
Albumin: 4.2 g/dL (ref 3.9–4.9)
Alkaline Phosphatase: 65 IU/L (ref 44–121)
BUN/Creatinine Ratio: 9 — ABNORMAL LOW (ref 10–24)
BUN: 12 mg/dL (ref 8–27)
Bilirubin Total: 0.6 mg/dL (ref 0.0–1.2)
CO2: 25 mmol/L (ref 20–29)
Calcium: 9.4 mg/dL (ref 8.6–10.2)
Chloride: 98 mmol/L (ref 96–106)
Creatinine, Ser: 1.29 mg/dL — ABNORMAL HIGH (ref 0.76–1.27)
Globulin, Total: 2.5 g/dL (ref 1.5–4.5)
Glucose: 94 mg/dL (ref 70–99)
Potassium: 4.6 mmol/L (ref 3.5–5.2)
Sodium: 138 mmol/L (ref 134–144)
Total Protein: 6.7 g/dL (ref 6.0–8.5)
eGFR: 62 mL/min/{1.73_m2} (ref >59)

## 2023-03-19 LAB — CBC WITH DIFFERENTIAL/PLATELET
Basophils Absolute: 0.1 10*3/uL (ref 0.0–0.2)
Basos: 1 %
EOS (ABSOLUTE): 0.1 10*3/uL (ref 0.0–0.4)
Eos: 2 %
Hematocrit: 43.3 % (ref 37.5–51.0)
Hemoglobin: 14 g/dL (ref 13.0–17.7)
Immature Grans (Abs): 0 10*3/uL (ref 0.0–0.1)
Immature Granulocytes: 0 %
Lymphocytes Absolute: 2 10*3/uL (ref 0.7–3.1)
Lymphs: 34 %
MCH: 31.7 pg (ref 26.6–33.0)
MCHC: 32.3 g/dL (ref 31.5–35.7)
MCV: 98 fL — ABNORMAL HIGH (ref 79–97)
Monocytes Absolute: 0.5 10*3/uL (ref 0.1–0.9)
Monocytes: 9 %
Neutrophils Absolute: 3 10*3/uL (ref 1.4–7.0)
Neutrophils: 54 %
Platelets: 205 10*3/uL (ref 150–450)
RBC: 4.42 x10E6/uL (ref 4.14–5.80)
RDW: 13.4 % (ref 11.6–15.4)
WBC: 5.7 10*3/uL (ref 3.4–10.8)

## 2023-03-19 LAB — LIPID PANEL
Chol/HDL Ratio: 4.7 ratio (ref 0.0–5.0)
Cholesterol, Total: 145 mg/dL (ref 100–199)
HDL: 31 mg/dL — ABNORMAL LOW (ref >39)
LDL Chol Calc (NIH): 84 mg/dL (ref 0–99)
Triglycerides: 173 mg/dL — ABNORMAL HIGH (ref 0–149)
VLDL Cholesterol Cal: 30 mg/dL (ref 5–40)

## 2023-04-05 LAB — HM DIABETES EYE EXAM

## 2023-05-08 ENCOUNTER — Other Ambulatory Visit: Payer: Self-pay | Admitting: Nurse Practitioner

## 2023-05-08 DIAGNOSIS — G629 Polyneuropathy, unspecified: Secondary | ICD-10-CM

## 2023-05-11 ENCOUNTER — Other Ambulatory Visit: Payer: Self-pay | Admitting: Nurse Practitioner

## 2023-05-11 DIAGNOSIS — E119 Type 2 diabetes mellitus without complications: Secondary | ICD-10-CM

## 2023-05-31 ENCOUNTER — Encounter: Payer: Self-pay | Admitting: Nurse Practitioner

## 2023-05-31 ENCOUNTER — Ambulatory Visit (INDEPENDENT_AMBULATORY_CARE_PROVIDER_SITE_OTHER): Payer: Medicare Other | Admitting: Nurse Practitioner

## 2023-05-31 VITALS — BP 95/59 | HR 74 | Temp 97.1°F | Resp 20 | Ht 68.0 in | Wt 214.0 lb

## 2023-05-31 DIAGNOSIS — Z0000 Encounter for general adult medical examination without abnormal findings: Secondary | ICD-10-CM | POA: Diagnosis not present

## 2023-05-31 NOTE — Progress Notes (Signed)
Subjective:    Shawn Malone is a 67 y.o. male who presents for a Welcome to Medicare exam.   Cardiac Risk Factors include: advanced age (>19men, >79 women);diabetes mellitus;dyslipidemia;hypertension;male gender;obesity (BMI >30kg/m2);smoking/ tobacco exposure     Objective:    Today's Vitals   05/31/23 1023  Resp: 20  Weight: 214 lb (97.1 kg)  Height: 5\' 8"  (1.727 m)   Body mass index is 32.54 kg/m.  Medications Outpatient Encounter Medications as of 05/31/2023  Medication Sig   amitriptyline (ELAVIL) 150 MG tablet TAKE 1 TABLET BY MOUTH AT BEDTIME.   aspirin 325 MG EC tablet Take 325 mg by mouth daily.   dapagliflozin propanediol (FARXIGA) 10 MG TABS tablet Take 1 tablet (10 mg total) by mouth daily before breakfast.   Dulaglutide (TRULICITY) 1.5 MG/0.5ML SOPN Inject 1.5 mg into the skin once a week.   gabapentin (NEURONTIN) 300 MG capsule TAKE 2-3 CAPSULES (600-900 MG TOTAL) BY MOUTH 3 (THREE) TIMES DAILY.   gemfibrozil (LOPID) 600 MG tablet Take 1 tablet (600 mg total) by mouth 2 (two) times daily before a meal.   metoprolol succinate (TOPROL-XL) 50 MG 24 hr tablet Take 1 tablet (50 mg total) by mouth daily. Take with or immediately following a meal.   pioglitazone (ACTOS) 45 MG tablet TAKE 1 TABLET BY MOUTH EVERY DAY   rosuvastatin (CRESTOR) 10 MG tablet Take 1 tablet (10 mg total) by mouth daily.   spironolactone (ALDACTONE) 25 MG tablet Take 1 tablet (25 mg total) by mouth daily.   tadalafil (CIALIS) 20 MG tablet Take 0.5-1 tablets (10-20 mg total) by mouth every other day as needed for erectile dysfunction.   No facility-administered encounter medications on file as of 05/31/2023.     History: Past Medical History:  Diagnosis Date   Diabetes mellitus without complication (HCC)    Elevated bilirubin    Extremity edema    Past Surgical History:  Procedure Laterality Date   None     As of 08/10/16    Family History  Problem Relation Age of Onset   Cancer Mother     Colon cancer Brother 24   Lung cancer Brother    Liver disease Neg Hx    Social History   Occupational History   Occupation: Retired  Tobacco Use   Smoking status: Former    Current packs/day: 0.00    Types: Cigarettes    Quit date: 05/22/1983    Years since quitting: 40.0   Smokeless tobacco: Never  Vaping Use   Vaping status: Never Used  Substance and Sexual Activity   Alcohol use: No    Alcohol/week: 30.0 standard drinks of alcohol    Types: 30 Cans of beer per week    Comment: Currently no ETOH 08/10/16; Previously drank significantly   Drug use: No   Sexual activity: Yes    Partners: Female    Tobacco Counseling Not a smoker  Immunizations and Health Maintenance  There is no immunization history on file for this patient. Health Maintenance Due  Topic Date Due   Medicare Annual Wellness (AWV)  Never done   HIV Screening  Never done   Colonoscopy  Never done    Activities of Daily Living    05/31/2023   10:25 AM  In your present state of health, do you have any difficulty performing the following activities:  Hearing? 0  Vision? 0  Difficulty concentrating or making decisions? 0  Walking or climbing stairs? 0  Dressing or bathing? 0  Doing errands, shopping? 0  Preparing Food and eating ? N  Using the Toilet? N  In the past six months, have you accidently leaked urine? N  Do you have problems with loss of bowel control? N  Managing your Medications? N  Managing your Finances? N  Housekeeping or managing your Housekeeping? N    Physical Exam   Physical Exam (optional), or other factors deemed appropriate based on the beneficiary's medical and social history and current clinical standards.   Advanced Directives: Does Patient Have a Medical Advance Directive?: No Would patient like information on creating a medical advance directive?: No - Patient declined   EKG:  normal EKG, normal sinus rhythm, unchanged from previous tracings, normal sinus  rhythm     Assessment:    This is a routine wellness  examination for this patient . Welcome to Medicare  Vision/Hearing screen No results found.   Goals      DIET - EAT MORE FRUITS AND VEGETABLES     Exercise 150 min/wk Moderate Activity         Depression Screen    03/18/2023    3:58 PM 12/17/2022    3:50 PM 09/18/2022    3:16 PM 06/18/2022    3:26 PM  PHQ 2/9 Scores  PHQ - 2 Score 0 0 0 0  PHQ- 9 Score 0 0 0 0     Fall Risk    05/31/2023   10:32 AM  Fall Risk   Falls in the past year? 0    Cognitive Function    05/31/2023   10:32 AM  MMSE - Mini Mental State Exam  Orientation to time 5  Orientation to Place 5  Registration 3  Attention/ Calculation 5  Recall 3  Language- name 2 objects 2  Language- repeat 1  Language- follow 3 step command 3  Language- read & follow direction 1  Write a sentence 1  Copy design 1  Total score 30        Patient Care Team: Bennie Pierini, FNP as PCP - General (Family Medicine) Jena Gauss, Gerrit Friends, MD as Consulting Physician (Gastroenterology) Danella Maiers, [facility] (Pharmacist)     Plan:   Welcome to medicare  I have personally reviewed and noted the following in the patient's chart:   Medical and social history Use of alcohol, tobacco or illicit drugs  Current medications and supplements Functional ability and status Nutritional status Physical activity Advanced directives List of other physicians Hospitalizations, surgeries, and ER visits in previous 12 months Vitals Screenings to include cognitive, depression, and falls Referrals and appointments  In addition, I have reviewed and discussed with patient certain preventive protocols, quality metrics, and best practice recommendations. A written personalized care plan for preventive services as well as general preventive health recommendations were provided to patient.     Mary-Margaret Daphine Deutscher, [place] 05/31/2023

## 2023-06-06 ENCOUNTER — Encounter: Payer: Self-pay | Admitting: Nurse Practitioner

## 2023-06-14 ENCOUNTER — Other Ambulatory Visit: Payer: Self-pay | Admitting: Nurse Practitioner

## 2023-06-14 DIAGNOSIS — G629 Polyneuropathy, unspecified: Secondary | ICD-10-CM

## 2023-06-20 ENCOUNTER — Encounter: Payer: Self-pay | Admitting: Nurse Practitioner

## 2023-06-20 ENCOUNTER — Ambulatory Visit (INDEPENDENT_AMBULATORY_CARE_PROVIDER_SITE_OTHER): Payer: Medicare Other | Admitting: Nurse Practitioner

## 2023-06-20 VITALS — BP 106/67 | HR 83 | Temp 96.7°F | Resp 20 | Ht 68.0 in | Wt 214.0 lb

## 2023-06-20 DIAGNOSIS — F5101 Primary insomnia: Secondary | ICD-10-CM

## 2023-06-20 DIAGNOSIS — Z7985 Long-term (current) use of injectable non-insulin antidiabetic drugs: Secondary | ICD-10-CM

## 2023-06-20 DIAGNOSIS — E1159 Type 2 diabetes mellitus with other circulatory complications: Secondary | ICD-10-CM

## 2023-06-20 DIAGNOSIS — E119 Type 2 diabetes mellitus without complications: Secondary | ICD-10-CM

## 2023-06-20 DIAGNOSIS — G629 Polyneuropathy, unspecified: Secondary | ICD-10-CM

## 2023-06-20 DIAGNOSIS — E1169 Type 2 diabetes mellitus with other specified complication: Secondary | ICD-10-CM | POA: Diagnosis not present

## 2023-06-20 DIAGNOSIS — I152 Hypertension secondary to endocrine disorders: Secondary | ICD-10-CM | POA: Diagnosis not present

## 2023-06-20 DIAGNOSIS — Z6833 Body mass index (BMI) 33.0-33.9, adult: Secondary | ICD-10-CM

## 2023-06-20 DIAGNOSIS — E785 Hyperlipidemia, unspecified: Secondary | ICD-10-CM

## 2023-06-20 DIAGNOSIS — Z7984 Long term (current) use of oral hypoglycemic drugs: Secondary | ICD-10-CM

## 2023-06-20 LAB — BAYER DCA HB A1C WAIVED: HB A1C (BAYER DCA - WAIVED): 5.4 % (ref 4.8–5.6)

## 2023-06-20 MED ORDER — GEMFIBROZIL 600 MG PO TABS: 600.0000 mg | ORAL_TABLET | Freq: Two times a day (BID) | ORAL | 1 refills | Status: DC

## 2023-06-20 MED ORDER — SPIRONOLACTONE 25 MG PO TABS: 25.0000 mg | ORAL_TABLET | Freq: Every day | ORAL | 1 refills | Status: DC

## 2023-06-20 MED ORDER — AMITRIPTYLINE HCL 150 MG PO TABS: 150.0000 mg | ORAL_TABLET | Freq: Every day | ORAL | 1 refills | Status: DC

## 2023-06-20 MED ORDER — PIOGLITAZONE HCL 45 MG PO TABS: 45.0000 mg | ORAL_TABLET | Freq: Every day | ORAL | 1 refills | Status: DC

## 2023-06-20 MED ORDER — SILDENAFIL CITRATE 20 MG PO TABS: 40.0000 mg | ORAL_TABLET | Freq: Every day | ORAL | 2 refills | Status: AC | PRN

## 2023-06-20 MED ORDER — TRULICITY 1.5 MG/0.5ML ~~LOC~~ SOAJ: 1.5000 mg | SUBCUTANEOUS | 3 refills | Status: DC

## 2023-06-20 MED ORDER — DAPAGLIFLOZIN PROPANEDIOL 10 MG PO TABS: 10.0000 mg | ORAL_TABLET | Freq: Every day | ORAL | 1 refills | Status: DC

## 2023-06-20 MED ORDER — ROSUVASTATIN CALCIUM 10 MG PO TABS: 10.0000 mg | ORAL_TABLET | Freq: Every day | ORAL | 1 refills | Status: DC

## 2023-06-20 MED ORDER — METOPROLOL SUCCINATE ER 50 MG PO TB24: 50.0000 mg | ORAL_TABLET | Freq: Every day | ORAL | 1 refills | Status: DC

## 2023-06-20 MED ORDER — GABAPENTIN 300 MG PO CAPS: 600.0000 mg | ORAL_CAPSULE | Freq: Three times a day (TID) | ORAL | 1 refills | Status: DC

## 2023-06-20 NOTE — Patient Instructions (Signed)

## 2023-06-20 NOTE — Progress Notes (Signed)
Subjective:    Patient ID: Shawn Malone, male    DOB: [birth year], 67 y.o.   MRN: [id number]   Chief Complaint: medical management of chronic issues     HPI:  Shawn Malone is a 67 y.o. who identifies as a male who was assigned male at birth.   Social history: Lives with: girlfriend Work history: retired   Water engineer in today for follow up of the following chronic medical issues:  1. Hypertension associated with diabetes (HCC) No c/o chest pain, sob or headache. Does not check blood pressure at home. BP Readings from Last 3 Encounters:  05/31/23 (!) 95/59  03/18/23 106/67  12/17/22 115/75     2. Hyperlipidemia associated with type 2 diabetes mellitus (HCC) Does not watch diet and does no exercise. Lab Results  Component Value Date   CHOL 145 03/18/2023   HDL 31 (L) 03/18/2023   LDLCALC 84 03/18/2023   TRIG 173 (H) 03/18/2023   CHOLHDL 4.7 03/18/2023      3. Diabetes mellitus treated with oral medication (HCC) Fasting blood sugars are running around 110-140-  NO LOW BLOOD SUGARS Lab Results  Component Value Date   HGBA1C 5.7 (H) 03/18/2023     4. Long-term current use of injectable noninsulin antidiabetic medication Is on trulicity and is doing well. Denies any medication side effects  5. Neuropathy Is on neurontin TID and elavil, is ding well. Still has some burning in bil feet  6. Primary insomnia The elavil seems to help him with his sleep issues  7. BMI 33.0-33.9,adult No recent weight changes Wt Readings from Last 3 Encounters:  06/20/23 214 lb (97.1 kg)  05/31/23 214 lb (97.1 kg)  03/18/23 208 lb (94.3 kg)   BMI Readings from Last 3 Encounters:  06/20/23 32.54 kg/m  05/31/23 32.54 kg/m  03/18/23 31.63 kg/m     New complaints: None today  Allergies  Allergen Reactions   Metformin And Related     Stomach and feeling bad   Outpatient Encounter Medications as of 06/20/2023  Medication Sig   amitriptyline (ELAVIL) 150 MG tablet TAKE 1  TABLET BY MOUTH AT BEDTIME.   aspirin 325 MG EC tablet Take 325 mg by mouth daily.   dapagliflozin propanediol (FARXIGA) 10 MG TABS tablet Take 1 tablet (10 mg total) by mouth daily before breakfast.   Dulaglutide (TRULICITY) 1.5 MG/0.5ML SOPN Inject 1.5 mg into the skin once a week.   gabapentin (NEURONTIN) 300 MG capsule TAKE 2-3 CAPSULES (600-900 MG TOTAL) BY MOUTH 3 (THREE) TIMES DAILY.   gemfibrozil (LOPID) 600 MG tablet Take 1 tablet (600 mg total) by mouth 2 (two) times daily before a meal.   metoprolol succinate (TOPROL-XL) 50 MG 24 hr tablet Take 1 tablet (50 mg total) by mouth daily. Take with or immediately following a meal.   pioglitazone (ACTOS) 45 MG tablet TAKE 1 TABLET BY MOUTH EVERY DAY   rosuvastatin (CRESTOR) 10 MG tablet Take 1 tablet (10 mg total) by mouth daily.   spironolactone (ALDACTONE) 25 MG tablet Take 1 tablet (25 mg total) by mouth daily.   tadalafil (CIALIS) 20 MG tablet Take 0.5-1 tablets (10-20 mg total) by mouth every other day as needed for erectile dysfunction.   No facility-administered encounter medications on file as of 06/20/2023.    Past Surgical History:  Procedure Laterality Date   None     As of 08/10/16    Family History  Problem Relation Age of Onset   Cancer Mother  Colon cancer Brother 101   Lung cancer Brother    Liver disease Neg Hx       Controlled substance contract: n/a     Review of Systems  Constitutional:  Negative for diaphoresis.  Eyes:  Negative for pain.  Respiratory:  Negative for shortness of breath.   Cardiovascular:  Negative for chest pain, palpitations and leg swelling.  Gastrointestinal:  Negative for abdominal pain.  Endocrine: Negative for polydipsia.  Skin:  Negative for rash.  Neurological:  Negative for dizziness, weakness and headaches.  Hematological:  Does not bruise/bleed easily.  All other systems reviewed and are negative.      Objective:   Physical Exam Vitals and nursing note reviewed.   Constitutional:      Appearance: Normal appearance. He is well-developed.  HENT:     Head: Normocephalic.     Nose: Nose normal.     Mouth/Throat:     Mouth: Mucous membranes are moist.     Pharynx: Oropharynx is clear.  Eyes:     Pupils: Pupils are equal, round, and reactive to light.  Neck:     Thyroid: No thyroid mass or thyromegaly.     Vascular: No carotid bruit or JVD.     Trachea: Phonation normal.  Cardiovascular:     Rate and Rhythm: Normal rate and regular rhythm.  Pulmonary:     Effort: Pulmonary effort is normal. No respiratory distress.     Breath sounds: Normal breath sounds.  Abdominal:     General: Bowel sounds are normal.     Palpations: Abdomen is soft.     Tenderness: There is no abdominal tenderness.  Musculoskeletal:        General: Normal range of motion.     Cervical back: Normal range of motion and neck supple.  Lymphadenopathy:     Cervical: No cervical adenopathy.  Skin:    General: Skin is warm and dry.  Neurological:     Mental Status: He is alert and oriented to person, place, and time.  Psychiatric:        Behavior: Behavior normal.        Thought Content: Thought content normal.        Judgment: Judgment normal.    BP 106/67   Pulse 83   Temp (!) 96.7 F (35.9 C) (Temporal)   Resp 20   Ht 5\' 8"  (1.727 m)   Wt 214 lb (97.1 kg)   SpO2 98%   BMI 32.54 kg/m   HGBA1c 5.4%      Assessment & Plan:   Shawn Malone comes in today with chief complaint of Medical Management of Chronic Issues   Diagnosis and orders addressed:  1. Hypertension associated with diabetes (HCC) Low sodium diet - CBC with Differential/Platelet - CMP14+EGFR - metoprolol succinate (TOPROL-XL) 50 MG 24 hr tablet; Take 1 tablet (50 mg total) by mouth daily. Take with or immediately following a meal.  Dispense: 90 tablet; Refill: 1 - spironolactone (ALDACTONE) 25 MG tablet; Take 1 tablet (25 mg total) by mouth daily.  Dispense: 90 tablet; Refill: 1  2.  Hyperlipidemia associated with type 2 diabetes mellitus (HCC) Low fat diet - Lipid panel - gemfibrozil (LOPID) 600 MG tablet; Take 1 tablet (600 mg total) by mouth 2 (two) times daily before a meal.  Dispense: 180 tablet; Refill: 1 - rosuvastatin (CRESTOR) 10 MG tablet; Take 1 tablet (10 mg total) by mouth daily.  Dispense: 90 tablet; Refill: 1  3. Diabetes mellitus treated  with oral medication (HCC) Continue to watch carbs in diet - Bayer DCA Hb A1c Waived - dapagliflozin propanediol (FARXIGA) 10 MG TABS tablet; Take 1 tablet (10 mg total) by mouth daily before breakfast.  Dispense: 90 tablet; Refill: 1 - Dulaglutide (TRULICITY) 1.5 MG/0.5ML SOAJ; Inject 1.5 mg into the skin once a week.  Dispense: 6 mL; Refill: 3 - pioglitazone (ACTOS) 45 MG tablet; Take 1 tablet (45 mg total) by mouth daily.  Dispense: 90 tablet; Refill: 1  4. Long-term current use of injectable noninsulin antidiabetic medication  5. Neuropathy Do n ot go barefooted - amitriptyline (ELAVIL) 150 MG tablet; Take 1 tablet (150 mg total) by mouth at bedtime.  Dispense: 90 tablet; Refill: 1 - gabapentin (NEURONTIN) 300 MG capsule; Take 2-3 capsules (600-900 mg total) by mouth 3 (three) times daily.  Dispense: 240 capsule; Refill: 1  6. Primary insomnia Bedtime routine  7. BMI 33.0-33.9,adult Discussed diet and exercise for person with BMI >25 Will recheck weight in 3-6 months    Labs pending Health Maintenance reviewed Diet and exercise encouraged  Follow up plan: 3 months   Mary-Margaret Daphine Deutscher, FNP

## 2023-06-21 LAB — CBC WITH DIFFERENTIAL/PLATELET
Basophils Absolute: 0.1 10*3/uL (ref 0.0–0.2)
Basos: 1 %
EOS (ABSOLUTE): 0.1 10*3/uL (ref 0.0–0.4)
Eos: 2 %
Hematocrit: 42.4 % (ref 37.5–51.0)
Hemoglobin: 14 g/dL (ref 13.0–17.7)
Immature Grans (Abs): 0 10*3/uL (ref 0.0–0.1)
Immature Granulocytes: 0 %
Lymphocytes Absolute: 2.2 10*3/uL (ref 0.7–3.1)
Lymphs: 39 %
MCH: 34.1 pg — ABNORMAL HIGH (ref 26.6–33.0)
MCHC: 33 g/dL (ref 31.5–35.7)
MCV: 103 fL — ABNORMAL HIGH (ref 79–97)
Monocytes Absolute: 0.6 10*3/uL (ref 0.1–0.9)
Monocytes: 10 %
Neutrophils Absolute: 2.7 10*3/uL (ref 1.4–7.0)
Neutrophils: 48 %
Platelets: 203 10*3/uL (ref 150–450)
RBC: 4.11 x10E6/uL — ABNORMAL LOW (ref 4.14–5.80)
RDW: 13.1 % (ref 11.6–15.4)
WBC: 5.6 10*3/uL (ref 3.4–10.8)

## 2023-06-21 LAB — CMP14+EGFR
ALT: 13 [IU]/L (ref 0–44)
AST: 16 [IU]/L (ref 0–40)
Albumin: 3.9 g/dL (ref 3.9–4.9)
Alkaline Phosphatase: 57 [IU]/L (ref 44–121)
BUN/Creatinine Ratio: 8 — ABNORMAL LOW (ref 10–24)
BUN: 11 mg/dL (ref 8–27)
Bilirubin Total: 0.6 mg/dL (ref 0.0–1.2)
CO2: 22 mmol/L (ref 20–29)
Calcium: 9.4 mg/dL (ref 8.6–10.2)
Chloride: 99 mmol/L (ref 96–106)
Creatinine, Ser: 1.35 mg/dL — ABNORMAL HIGH (ref 0.76–1.27)
Globulin, Total: 2.5 g/dL (ref 1.5–4.5)
Glucose: 83 mg/dL (ref 70–99)
Potassium: 4.7 mmol/L (ref 3.5–5.2)
Sodium: 136 mmol/L (ref 134–144)
Total Protein: 6.4 g/dL (ref 6.0–8.5)
eGFR: 58 mL/min/{1.73_m2} — ABNORMAL LOW (ref >59)

## 2023-06-21 LAB — LIPID PANEL
Chol/HDL Ratio: 5.1 ratio — ABNORMAL HIGH (ref 0.0–5.0)
Cholesterol, Total: 149 mg/dL (ref 100–199)
HDL: 29 mg/dL — ABNORMAL LOW (ref >39)
LDL Chol Calc (NIH): 95 mg/dL (ref 0–99)
Triglycerides: 138 mg/dL (ref 0–149)
VLDL Cholesterol Cal: 25 mg/dL (ref 5–40)

## 2023-09-17 ENCOUNTER — Encounter: Payer: Self-pay | Admitting: Nurse Practitioner

## 2023-09-17 ENCOUNTER — Ambulatory Visit (INDEPENDENT_AMBULATORY_CARE_PROVIDER_SITE_OTHER): Payer: Medicare Other | Admitting: Nurse Practitioner

## 2023-09-17 VITALS — BP 99/65 | HR 91 | Temp 97.0°F | Ht 68.0 in | Wt 213.0 lb

## 2023-09-17 DIAGNOSIS — I152 Hypertension secondary to endocrine disorders: Secondary | ICD-10-CM

## 2023-09-17 DIAGNOSIS — Z125 Encounter for screening for malignant neoplasm of prostate: Secondary | ICD-10-CM

## 2023-09-17 DIAGNOSIS — R351 Nocturia: Secondary | ICD-10-CM

## 2023-09-17 DIAGNOSIS — Z7984 Long term (current) use of oral hypoglycemic drugs: Secondary | ICD-10-CM

## 2023-09-17 DIAGNOSIS — E1159 Type 2 diabetes mellitus with other circulatory complications: Secondary | ICD-10-CM | POA: Diagnosis not present

## 2023-09-17 DIAGNOSIS — E1169 Type 2 diabetes mellitus with other specified complication: Secondary | ICD-10-CM

## 2023-09-17 DIAGNOSIS — E119 Type 2 diabetes mellitus without complications: Secondary | ICD-10-CM

## 2023-09-17 DIAGNOSIS — E785 Hyperlipidemia, unspecified: Secondary | ICD-10-CM

## 2023-09-17 LAB — LIPID PANEL

## 2023-09-17 LAB — BAYER DCA HB A1C WAIVED: HB A1C (BAYER DCA - WAIVED): 5.8 % — ABNORMAL HIGH (ref 4.8–5.6)

## 2023-09-17 NOTE — Progress Notes (Signed)
 Subjective:    Patient ID: Shawn Malone, male    DOB: November 20, 1955, 68 y.o.   MRN: [id number]   Chief Complaint: medical management of chronic issues     HPI:  Shawn Malone is a 68 y.o. who identifies as a male who was assigned male at birth.   Social history: Lives with: girlfriend Work history: retired   Water engineer in today for follow up of the following chronic medical issues:  1. Hypertension associated with diabetes (HCC) No c/o chest pain, sob or headache. Does not check blood pressure at home. BP Readings from Last 3 Encounters:  06/20/23 106/67  05/31/23 (!) 95/59  03/18/23 106/67     2. Hyperlipidemia associated with type 2 diabetes mellitus (HCC) Does not watch diet and does no exercise. Lab Results  Component Value Date   CHOL 149 06/20/2023   HDL 29 (L) 06/20/2023   LDLCALC 95 06/20/2023   TRIG 138 06/20/2023   CHOLHDL 5.1 (H) 06/20/2023      3. Diabetes mellitus treated with oral medication (HCC) Fasting blood sugars are running around 110-140-  NO LOW BLOOD SUGARS Lab Results  Component Value Date   HGBA1C 5.4 06/20/2023     4. Long-term current use of injectable noninsulin antidiabetic medication Is on trulicity and is doing well. Denies any medication side effects  5. Neuropathy Is on neurontin TID and elavil, is ding well. Still has some burning in bil feet  6. Primary insomnia The elavil seems to help him with his sleep issues  7. BMI 33.0-33.9,adult No recent weight changes Wt Readings from Last 3 Encounters:  09/17/23 213 lb (96.6 kg)  06/20/23 214 lb (97.1 kg)  05/31/23 214 lb (97.1 kg)   BMI Readings from Last 3 Encounters:  09/17/23 32.39 kg/m  06/20/23 32.54 kg/m  05/31/23 32.54 kg/m      New complaints: None today  Allergies  Allergen Reactions   Metformin And Related     Stomach and feeling bad   Outpatient Encounter Medications as of 09/17/2023  Medication Sig   amitriptyline (ELAVIL) 150 MG tablet Take 1  tablet (150 mg total) by mouth at bedtime.   aspirin 325 MG EC tablet Take 325 mg by mouth daily.   dapagliflozin propanediol (FARXIGA) 10 MG TABS tablet Take 1 tablet (10 mg total) by mouth daily before breakfast.   Dulaglutide (TRULICITY) 1.5 MG/0.5ML SOAJ Inject 1.5 mg into the skin once a week.   gabapentin (NEURONTIN) 300 MG capsule Take 2-3 capsules (600-900 mg total) by mouth 3 (three) times daily.   gemfibrozil (LOPID) 600 MG tablet Take 1 tablet (600 mg total) by mouth 2 (two) times daily before a meal.   metoprolol succinate (TOPROL-XL) 50 MG 24 hr tablet Take 1 tablet (50 mg total) by mouth daily. Take with or immediately following a meal.   pioglitazone (ACTOS) 45 MG tablet Take 1 tablet (45 mg total) by mouth daily.   rosuvastatin (CRESTOR) 10 MG tablet Take 1 tablet (10 mg total) by mouth daily.   sildenafil (REVATIO) 20 MG tablet Take 2 tablets (40 mg total) by mouth daily as needed.   spironolactone (ALDACTONE) 25 MG tablet Take 1 tablet (25 mg total) by mouth daily.   No facility-administered encounter medications on file as of 09/17/2023.    Past Surgical History:  Procedure Laterality Date   None     As of 08/10/16    Family History  Problem Relation Age of Onset   Cancer Mother  Colon cancer Brother 5   Lung cancer Brother    Liver disease Neg Hx       Controlled substance contract: n/a     Review of Systems  Constitutional:  Negative for diaphoresis.  Eyes:  Negative for pain.  Respiratory:  Negative for shortness of breath.   Cardiovascular:  Negative for chest pain, palpitations and leg swelling.  Gastrointestinal:  Negative for abdominal pain.  Endocrine: Negative for polydipsia.  Skin:  Negative for rash.  Neurological:  Negative for dizziness, weakness and headaches.  Hematological:  Does not bruise/bleed easily.  All other systems reviewed and are negative.      Objective:   Physical Exam Vitals and nursing note reviewed.   Constitutional:      Appearance: Normal appearance. He is well-developed.  HENT:     Head: Normocephalic.     Nose: Nose normal.     Mouth/Throat:     Mouth: Mucous membranes are moist.     Pharynx: Oropharynx is clear.  Eyes:     Pupils: Pupils are equal, round, and reactive to light.  Neck:     Thyroid: No thyroid mass or thyromegaly.     Vascular: No carotid bruit or JVD.     Trachea: Phonation normal.  Cardiovascular:     Rate and Rhythm: Normal rate and regular rhythm.  Pulmonary:     Effort: Pulmonary effort is normal. No respiratory distress.     Breath sounds: Normal breath sounds.  Abdominal:     General: Bowel sounds are normal.     Palpations: Abdomen is soft.     Tenderness: There is no abdominal tenderness.  Musculoskeletal:        General: Normal range of motion.     Cervical back: Normal range of motion and neck supple.  Lymphadenopathy:     Cervical: No cervical adenopathy.  Skin:    General: Skin is warm and dry.  Neurological:     Mental Status: He is alert and oriented to person, place, and time.  Psychiatric:        Behavior: Behavior normal.        Thought Content: Thought content normal.        Judgment: Judgment normal.    BP 99/65   Pulse 91   Temp (!) 97 F (36.1 C)   Ht 5\' 8"  (1.727 m)   Wt 213 lb (96.6 kg)   SpO2 96%   BMI 32.39 kg/m    HGBA1c 5.8%      Assessment & Plan:   Shawn Malone comes in today with chief complaint of No chief complaint on file.   Diagnosis and orders addressed:  1. Hypertension associated with diabetes (HCC) Low sodium diet - CBC with Differential/Platelet - CMP14+EGFR - metoprolol succinate (TOPROL-XL) 50 MG 24 hr tablet; Take 1 tablet (50 mg total) by mouth daily. Take with or immediately following a meal.  Dispense: 90 tablet; Refill: 1 - spironolactone (ALDACTONE) 25 MG tablet; Take 1 tablet (25 mg total) by mouth daily.  Dispense: 90 tablet; Refill: 1  2. Hyperlipidemia associated with type 2  diabetes mellitus (HCC) Low fat diet - Lipid panel - gemfibrozil (LOPID) 600 MG tablet; Take 1 tablet (600 mg total) by mouth 2 (two) times daily before a meal.  Dispense: 180 tablet; Refill: 1 - rosuvastatin (CRESTOR) 10 MG tablet; Take 1 tablet (10 mg total) by mouth daily.  Dispense: 90 tablet; Refill: 1  3. Diabetes mellitus treated with oral medication (HCC)  Continue to watch carbs in diet - Bayer DCA Hb A1c Waived - dapagliflozin propanediol (FARXIGA) 10 MG TABS tablet; Take 1 tablet (10 mg total) by mouth daily before breakfast.  Dispense: 90 tablet; Refill: 1 - Dulaglutide (TRULICITY) 1.5 MG/0.5ML SOAJ; Inject 1.5 mg into the skin once a week.  Dispense: 6 mL; Refill: 3 - pioglitazone (ACTOS) 45 MG tablet; Take 1 tablet (45 mg total) by mouth daily.  Dispense: 90 tablet; Refill: 1  4. Long-term current use of injectable noninsulin antidiabetic medication  5. Neuropathy Do n ot go barefooted - amitriptyline (ELAVIL) 150 MG tablet; Take 1 tablet (150 mg total) by mouth at bedtime.  Dispense: 90 tablet; Refill: 1 - gabapentin (NEURONTIN) 300 MG capsule; Take 2-3 capsules (600-900 mg total) by mouth 3 (three) times daily.  Dispense: 240 capsule; Refill: 1  6. Primary insomnia Bedtime routine  7. BMI 33.0-33.9,adult Discussed diet and exercise for person with BMI >25 Will recheck weight in 3-6 months    Labs pending Health Maintenance reviewed Diet and exercise encouraged  Follow up plan: 3 months   Mary-Margaret Daphine Deutscher, FNP

## 2023-09-17 NOTE — Patient Instructions (Signed)
 Neuropathic Pain Neuropathic pain is pain caused by damage to the nerves that are responsible for certain sensations in your body (sensory nerves). Neuropathic pain can make you more sensitive to pain. Even a minor sensation can feel very painful. This is usually a long-term (chronic) condition that can be difficult to treat. The type of pain differs from person to person. It may: Start suddenly (acute), or it may develop slowly and become chronic. Come and go as damaged nerves heal, or it may stay at the same level for years. Cause emotional distress, loss of sleep, and a lower quality of life. What are the causes? The most common cause of this condition is diabetes. Many other diseases and conditions can also cause neuropathic pain. Causes of neuropathic pain can be classified as: Toxic. This is caused by medicines and chemicals. The most common causes of toxic neuropathic pain is damage from medicines that kill cancer cells (chemotherapy) or alcohol abuse. Metabolic. This can be caused by: Diabetes. Lack of vitamins like B12. Traumatic. Any injury that cuts, crushes, or stretches a nerve can cause damage and pain. Compression-related. If a sensory nerve gets trapped or compressed for a long period of time, the blood supply to the nerve can be cut off. Vascular. Many blood vessel diseases can cause neuropathic pain by decreasing blood supply and oxygen to nerves. Autoimmune. This type of pain results from diseases in which the body's defense system (immune system) mistakenly attacks sensory nerves. Examples of autoimmune diseases that can cause neuropathic pain include lupus and multiple sclerosis. Infectious. Many types of viral infections can damage sensory nerves and cause pain. Shingles infection is a common cause of this type of pain. Inherited. Neuropathic pain can be a symptom of many diseases that are passed down through families (genetic). What increases the risk? You are more likely to  develop this condition if: You have diabetes. You smoke. You drink too much alcohol. You are taking certain medicines, including chemotherapy or medicines that treat immune system disorders. What are the signs or symptoms? The main symptom is pain. Neuropathic pain is often described as: Burning. Shock-like. Stinging. Hot or cold. Itching. How is this diagnosed? No single test can diagnose neuropathic pain. It is diagnosed based on: A physical exam and your symptoms. Your health care provider will ask you about your pain. You may be asked to use a pain scale to describe how bad your pain is. Tests. These may be done to see if you have a cause and location of any nerve damage. They include: Nerve conduction studies and electromyography to test how well nerve signals travel through your nerves and muscles (electrodiagnostic testing). Skin biopsy to evaluate for small fiber neuropathy. Imaging studies, such as: X-rays. CT scan. MRI. How is this treated? Treatment for neuropathic pain may change over time. You may need to try different treatment options or a combination of treatments. Some options include: Treating the underlying cause of the neuropathy, such as diabetes, kidney disease, or vitamin deficiencies. Stopping medicines that can cause neuropathy, such as chemotherapy. Medicine to relieve pain. Medicines may include: Prescription or over-the-counter pain medicine. Anti-seizure medicine. Antidepressant medicines. Pain-relieving patches or creams that are applied to painful areas of skin. A medicine to numb the area (local anesthetic), which can be injected as a nerve block. Transcutaneous nerve stimulation. This uses electrical currents to block painful nerve signals. The treatment is painless. Alternative treatments, such as: Acupuncture. Meditation. Massage. Occupational or physical therapy. Pain management programs. Counseling. Follow  these instructions at  home: Medicines  Take over-the-counter and prescription medicines only as told by your health care provider. Ask your health care provider if the medicine prescribed to you: Requires you to avoid driving or using machinery. Can cause constipation. You may need to take these actions to prevent or treat constipation: Drink enough fluid to keep your urine pale yellow. Take over-the-counter or prescription medicines. Eat foods that are high in fiber, such as beans, whole grains, and fresh fruits and vegetables. Limit foods that are high in fat and processed sugars, such as fried or sweet foods. Lifestyle  Have a good support system at home. Consider joining a chronic pain support group. Do not use any products that contain nicotine or tobacco. These products include cigarettes, chewing tobacco, and vaping devices, such as e-cigarettes. If you need help quitting, ask your health care provider. Do not drink alcohol. General instructions Learn as much as you can about your condition. Work closely with all your health care providers to find the treatment plan that works best for you. Ask your health care provider what activities are safe for you. Keep all follow-up visits. This is important. Contact a health care provider if: Your pain treatments are not working. You are having side effects from your medicines. You are struggling with tiredness (fatigue), mood changes, depression, or anxiety. Get help right away if: You have thoughts of hurting yourself. Get help right away if you feel like you may hurt yourself or others, or have thoughts about taking your own life. Go to your nearest emergency room or: Call 911. Call the National Suicide Prevention Lifeline at 947-472-5826 or 988. This is open 24 hours a day. Text the Crisis Text Line at 657-070-3362. Summary Neuropathic pain is pain caused by damage to the nerves that are responsible for certain sensations in your body (sensory  nerves). Neuropathic pain may come and go as damaged nerves heal, or it may stay at the same level for years. Neuropathic pain is usually a long-term condition that can be difficult to treat. Consider joining a chronic pain support group. This information is not intended to replace advice given to you by your health care provider. Make sure you discuss any questions you have with your health care provider. Document Revised: 03/13/2021 Document Reviewed: 03/13/2021 Elsevier Patient Education  2024 [employer].

## 2023-09-18 LAB — CMP14+EGFR
ALT: 10 IU/L (ref 0–44)
AST: 15 IU/L (ref 0–40)
Albumin: 4 g/dL (ref 3.9–4.9)
Alkaline Phosphatase: 60 IU/L (ref 44–121)
BUN/Creatinine Ratio: 11 (ref 10–24)
BUN: 13 mg/dL (ref 8–27)
Bilirubin Total: 0.5 mg/dL (ref 0.0–1.2)
CO2: 25 mmol/L (ref 20–29)
Calcium: 9.3 mg/dL (ref 8.6–10.2)
Chloride: 100 mmol/L (ref 96–106)
Creatinine, Ser: 1.2 mg/dL (ref 0.76–1.27)
Globulin, Total: 2.3 g/dL (ref 1.5–4.5)
Glucose: 111 mg/dL — ABNORMAL HIGH (ref 70–99)
Potassium: 4.4 mmol/L (ref 3.5–5.2)
Sodium: 135 mmol/L (ref 134–144)
Total Protein: 6.3 g/dL (ref 6.0–8.5)
eGFR: 67 mL/min/{1.73_m2} (ref >59)

## 2023-09-18 LAB — CBC WITH DIFFERENTIAL/PLATELET
Basophils Absolute: 0.1 10*3/uL (ref 0.0–0.2)
Basos: 1 %
EOS (ABSOLUTE): 0.1 10*3/uL (ref 0.0–0.4)
Eos: 2 %
Hematocrit: 40.9 % (ref 37.5–51.0)
Hemoglobin: 13.7 g/dL (ref 13.0–17.7)
Immature Grans (Abs): 0 10*3/uL (ref 0.0–0.1)
Immature Granulocytes: 0 %
Lymphocytes Absolute: 1.6 10*3/uL (ref 0.7–3.1)
Lymphs: 29 %
MCH: 33.5 pg — ABNORMAL HIGH (ref 26.6–33.0)
MCHC: 33.5 g/dL (ref 31.5–35.7)
MCV: 100 fL — ABNORMAL HIGH (ref 79–97)
Monocytes Absolute: 0.5 10*3/uL (ref 0.1–0.9)
Monocytes: 10 %
Neutrophils Absolute: 3.3 10*3/uL (ref 1.4–7.0)
Neutrophils: 58 %
Platelets: 211 10*3/uL (ref 150–450)
RBC: 4.09 x10E6/uL — ABNORMAL LOW (ref 4.14–5.80)
RDW: 13.2 % (ref 11.6–15.4)
WBC: 5.6 10*3/uL (ref 3.4–10.8)

## 2023-09-18 LAB — PSA, TOTAL AND FREE
PSA, Free Pct: 26.9 %
PSA, Free: 0.35 ng/mL
Prostate Specific Ag, Serum: 1.3 ng/mL (ref 0.0–4.0)

## 2023-09-18 LAB — LIPID PANEL
Cholesterol, Total: 144 mg/dL (ref 100–199)
HDL: 29 mg/dL — ABNORMAL LOW (ref >39)
LDL CALC COMMENT:: 5 ratio (ref 0.0–5.0)
LDL Chol Calc (NIH): 91 mg/dL (ref 0–99)
Triglycerides: 131 mg/dL (ref 0–149)
VLDL Cholesterol Cal: 24 mg/dL (ref 5–40)

## 2023-12-06 ENCOUNTER — Encounter: Payer: Self-pay | Admitting: Nurse Practitioner

## 2023-12-06 ENCOUNTER — Ambulatory Visit (INDEPENDENT_AMBULATORY_CARE_PROVIDER_SITE_OTHER): Payer: Medicare Other | Admitting: Nurse Practitioner

## 2023-12-06 VITALS — BP 94/61 | HR 78 | Temp 97.1°F | Ht 68.0 in | Wt 205.0 lb

## 2023-12-06 DIAGNOSIS — E1159 Type 2 diabetes mellitus with other circulatory complications: Secondary | ICD-10-CM | POA: Diagnosis not present

## 2023-12-06 DIAGNOSIS — I152 Hypertension secondary to endocrine disorders: Secondary | ICD-10-CM

## 2023-12-06 DIAGNOSIS — E1169 Type 2 diabetes mellitus with other specified complication: Secondary | ICD-10-CM

## 2023-12-06 DIAGNOSIS — E119 Type 2 diabetes mellitus without complications: Secondary | ICD-10-CM

## 2023-12-06 DIAGNOSIS — E785 Hyperlipidemia, unspecified: Secondary | ICD-10-CM

## 2023-12-06 DIAGNOSIS — G629 Polyneuropathy, unspecified: Secondary | ICD-10-CM

## 2023-12-06 DIAGNOSIS — Z7985 Long-term (current) use of injectable non-insulin antidiabetic drugs: Secondary | ICD-10-CM | POA: Diagnosis not present

## 2023-12-06 DIAGNOSIS — F5101 Primary insomnia: Secondary | ICD-10-CM

## 2023-12-06 DIAGNOSIS — Z7984 Long term (current) use of oral hypoglycemic drugs: Secondary | ICD-10-CM

## 2023-12-06 DIAGNOSIS — Z6833 Body mass index (BMI) 33.0-33.9, adult: Secondary | ICD-10-CM

## 2023-12-06 LAB — LIPID PANEL

## 2023-12-06 LAB — BAYER DCA HB A1C WAIVED: HB A1C (BAYER DCA - WAIVED): 5.4 % (ref 4.8–5.6)

## 2023-12-06 MED ORDER — GABAPENTIN 300 MG PO CAPS: 600.0000 mg | ORAL_CAPSULE | Freq: Three times a day (TID) | ORAL | 1 refills | Status: DC

## 2023-12-06 MED ORDER — DAPAGLIFLOZIN PROPANEDIOL 10 MG PO TABS: 10.0000 mg | ORAL_TABLET | Freq: Every day | ORAL | 1 refills | Status: DC

## 2023-12-06 MED ORDER — TRULICITY 1.5 MG/0.5ML ~~LOC~~ SOAJ: 1.5000 mg | SUBCUTANEOUS | 3 refills | Status: DC

## 2023-12-06 MED ORDER — GEMFIBROZIL 600 MG PO TABS: 600.0000 mg | ORAL_TABLET | Freq: Two times a day (BID) | ORAL | 1 refills | Status: DC

## 2023-12-06 MED ORDER — AMITRIPTYLINE HCL 150 MG PO TABS: 150.0000 mg | ORAL_TABLET | Freq: Every day | ORAL | 1 refills | Status: DC

## 2023-12-06 MED ORDER — SPIRONOLACTONE 25 MG PO TABS: 25.0000 mg | ORAL_TABLET | Freq: Every day | ORAL | 1 refills | Status: DC

## 2023-12-06 MED ORDER — PIOGLITAZONE HCL 45 MG PO TABS: 45.0000 mg | ORAL_TABLET | Freq: Every day | ORAL | 1 refills | Status: DC

## 2023-12-06 MED ORDER — ROSUVASTATIN CALCIUM 10 MG PO TABS: 10.0000 mg | ORAL_TABLET | Freq: Every day | ORAL | 1 refills | Status: DC

## 2023-12-06 MED ORDER — METOPROLOL SUCCINATE ER 50 MG PO TB24: 50.0000 mg | ORAL_TABLET | Freq: Every day | ORAL | 1 refills | Status: DC

## 2023-12-06 NOTE — Patient Instructions (Signed)
 Neuropathic Pain Neuropathic pain is pain caused by damage to the nerves that are responsible for certain sensations in your body (sensory nerves). Neuropathic pain can make you more sensitive to pain. Even a minor sensation can feel very painful. This is usually a long-term (chronic) condition that can be difficult to treat. The type of pain differs from person to person. It may: Start suddenly (acute), or it may develop slowly and become chronic. Come and go as damaged nerves heal, or it may stay at the same level for years. Cause emotional distress, loss of sleep, and a lower quality of life. What are the causes? The most common cause of this condition is diabetes. Many other diseases and conditions can also cause neuropathic pain. Causes of neuropathic pain can be classified as: Toxic. This is caused by medicines and chemicals. The most common causes of toxic neuropathic pain is damage from medicines that kill cancer cells (chemotherapy) or alcohol abuse. Metabolic. This can be caused by: Diabetes. Lack of vitamins like B12. Traumatic. Any injury that cuts, crushes, or stretches a nerve can cause damage and pain. Compression-related. If a sensory nerve gets trapped or compressed for a long period of time, the blood supply to the nerve can be cut off. Vascular. Many blood vessel diseases can cause neuropathic pain by decreasing blood supply and oxygen to nerves. Autoimmune. This type of pain results from diseases in which the body's defense system (immune system) mistakenly attacks sensory nerves. Examples of autoimmune diseases that can cause neuropathic pain include lupus and multiple sclerosis. Infectious. Many types of viral infections can damage sensory nerves and cause pain. Shingles infection is a common cause of this type of pain. Inherited. Neuropathic pain can be a symptom of many diseases that are passed down through families (genetic). What increases the risk? You are more likely to  develop this condition if: You have diabetes. You smoke. You drink too much alcohol. You are taking certain medicines, including chemotherapy or medicines that treat immune system disorders. What are the signs or symptoms? The main symptom is pain. Neuropathic pain is often described as: Burning. Shock-like. Stinging. Hot or cold. Itching. How is this diagnosed? No single test can diagnose neuropathic pain. It is diagnosed based on: A physical exam and your symptoms. Your health care provider will ask you about your pain. You may be asked to use a pain scale to describe how bad your pain is. Tests. These may be done to see if you have a cause and location of any nerve damage. They include: Nerve conduction studies and electromyography to test how well nerve signals travel through your nerves and muscles (electrodiagnostic testing). Skin biopsy to evaluate for small fiber neuropathy. Imaging studies, such as: X-rays. CT scan. MRI. How is this treated? Treatment for neuropathic pain may change over time. You may need to try different treatment options or a combination of treatments. Some options include: Treating the underlying cause of the neuropathy, such as diabetes, kidney disease, or vitamin deficiencies. Stopping medicines that can cause neuropathy, such as chemotherapy. Medicine to relieve pain. Medicines may include: Prescription or over-the-counter pain medicine. Anti-seizure medicine. Antidepressant medicines. Pain-relieving patches or creams that are applied to painful areas of skin. A medicine to numb the area (local anesthetic), which can be injected as a nerve block. Transcutaneous nerve stimulation. This uses electrical currents to block painful nerve signals. The treatment is painless. Alternative treatments, such as: Acupuncture. Meditation. Massage. Occupational or physical therapy. Pain management programs. Counseling. Follow  these instructions at  home: Medicines  Take over-the-counter and prescription medicines only as told by your health care provider. Ask your health care provider if the medicine prescribed to you: Requires you to avoid driving or using machinery. Can cause constipation. You may need to take these actions to prevent or treat constipation: Drink enough fluid to keep your urine pale yellow. Take over-the-counter or prescription medicines. Eat foods that are high in fiber, such as beans, whole grains, and fresh fruits and vegetables. Limit foods that are high in fat and processed sugars, such as fried or sweet foods. Lifestyle  Have a good support system at home. Consider joining a chronic pain support group. Do not use any products that contain nicotine or tobacco. These products include cigarettes, chewing tobacco, and vaping devices, such as e-cigarettes. If you need help quitting, ask your health care provider. Do not drink alcohol. General instructions Learn as much as you can about your condition. Work closely with all your health care providers to find the treatment plan that works best for you. Ask your health care provider what activities are safe for you. Keep all follow-up visits. This is important. Contact a health care provider if: Your pain treatments are not working. You are having side effects from your medicines. You are struggling with tiredness (fatigue), mood changes, depression, or anxiety. Get help right away if: You have thoughts of hurting yourself. Get help right away if you feel like you may hurt yourself or others, or have thoughts about taking your own life. Go to your nearest emergency room or: Call 911. Call the National Suicide Prevention Lifeline at 947-472-5826 or 988. This is open 24 hours a day. Text the Crisis Text Line at 657-070-3362. Summary Neuropathic pain is pain caused by damage to the nerves that are responsible for certain sensations in your body (sensory  nerves). Neuropathic pain may come and go as damaged nerves heal, or it may stay at the same level for years. Neuropathic pain is usually a long-term condition that can be difficult to treat. Consider joining a chronic pain support group. This information is not intended to replace advice given to you by your health care provider. Make sure you discuss any questions you have with your health care provider. Document Revised: 03/13/2021 Document Reviewed: 03/13/2021 Elsevier Patient Education  2024 [employer].

## 2023-12-06 NOTE — Progress Notes (Signed)
 Subjective:    Patient ID: Shawn Malone, male    DOB: 1955-08-18, 68 y.o.   MRN: [id number]   Chief Complaint: medical management of chronic issues     HPI:  [name] is a 68 y.o. who identifies as a male who was assigned male at birth.   Social history: Lives with: girlfriend Work history: retired   Water engineer in today for follow up of the following chronic medical issues:  1. Hypertension associated with diabetes (HCC) No c/o chest pain, sob or headache. Does not check blood pressure at home. BP Readings from Last 3 Encounters:  09/17/23 99/65  06/20/23 106/67  05/31/23 (!) 95/59     2. Hyperlipidemia associated with type 2 diabetes mellitus (HCC) Does not watch diet and does no exercise. Lab Results  Component Value Date   CHOL 144 09/17/2023   HDL 29 (L) 09/17/2023   LDLCALC 91 09/17/2023   TRIG 131 09/17/2023   CHOLHDL 5.0 09/17/2023   The 10-year ASCVD risk score (Arnett DK, et al., 2019) is: 18.9%    3. Diabetes mellitus treated with oral medication (HCC) Fasting blood sugars are running around 110-140-  NO LOW BLOOD SUGARS Lab Results  Component Value Date   HGBA1C 5.8 (H) 09/17/2023     4. Long-term current use of injectable noninsulin antidiabetic medication Is on trulicity  and is doing well. Denies any medication side effects  5. Neuropathy Is on neurontin  TID and elavil , is ding well. Still has some burning in bil feet  6. Primary insomnia The elavil  seems to help him with his sleep issues  7. BMI 33.0-33.9,adult Weight is down 8lbs  Wt Readings from Last 3 Encounters:  12/06/23 205 lb (93 kg)  09/17/23 213 lb (96.6 kg)  06/20/23 214 lb (97.1 kg)   BMI Readings from Last 3 Encounters:  12/06/23 31.17 kg/m  09/17/23 32.39 kg/m  06/20/23 32.54 kg/m       New complaints: None today  Allergies  Allergen Reactions   Metformin  And Related     Stomach and feeling bad   Outpatient Encounter Medications as of 12/06/2023   Medication Sig   amitriptyline  (ELAVIL ) 150 MG tablet Take 1 tablet (150 mg total) by mouth at bedtime.   aspirin 325 MG EC tablet Take 325 mg by mouth daily.   dapagliflozin  propanediol (FARXIGA ) 10 MG TABS tablet Take 1 tablet (10 mg total) by mouth daily before breakfast.   Dulaglutide  (TRULICITY ) 1.5 MG/0.5ML SOAJ Inject 1.5 mg into the skin once a week.   gabapentin  (NEURONTIN ) 300 MG capsule Take 2-3 capsules (600-900 mg total) by mouth 3 (three) times daily.   gemfibrozil  (LOPID ) 600 MG tablet Take 1 tablet (600 mg total) by mouth 2 (two) times daily before a meal.   metoprolol  succinate (TOPROL -XL) 50 MG 24 hr tablet Take 1 tablet (50 mg total) by mouth daily. Take with or immediately following a meal.   pioglitazone  (ACTOS ) 45 MG tablet Take 1 tablet (45 mg total) by mouth daily.   rosuvastatin  (CRESTOR ) 10 MG tablet Take 1 tablet (10 mg total) by mouth daily.   sildenafil  (REVATIO ) 20 MG tablet Take 2 tablets (40 mg total) by mouth daily as needed.   spironolactone  (ALDACTONE ) 25 MG tablet Take 1 tablet (25 mg total) by mouth daily.   No facility-administered encounter medications on file as of 12/06/2023.    Past Surgical History:  Procedure Laterality Date   None     As of 08/10/16  Family History  Problem Relation Age of Onset   Cancer Mother    Colon cancer Brother 26   Lung cancer Brother    Liver disease Neg Hx       Controlled substance contract: n/a     Review of Systems  Constitutional:  Negative for diaphoresis.  Eyes:  Negative for pain.  Respiratory:  Negative for shortness of breath.   Cardiovascular:  Negative for chest pain, palpitations and leg swelling.  Gastrointestinal:  Negative for abdominal pain.  Endocrine: Negative for polydipsia.  Skin:  Negative for rash.  Neurological:  Negative for dizziness, weakness and headaches.  Hematological:  Does not bruise/bleed easily.  All other systems reviewed and are negative.      Objective:    Physical Exam Vitals and nursing note reviewed.  Constitutional:      Appearance: Normal appearance. He is well-developed.  HENT:     Head: Normocephalic.     Nose: Nose normal.     Mouth/Throat:     Mouth: Mucous membranes are moist.     Pharynx: Oropharynx is clear.  Eyes:     Pupils: Pupils are equal, round, and reactive to light.  Neck:     Thyroid: No thyroid mass or thyromegaly.     Vascular: No carotid bruit or JVD.     Trachea: Phonation normal.  Cardiovascular:     Rate and Rhythm: Normal rate and regular rhythm.  Pulmonary:     Effort: Pulmonary effort is normal. No respiratory distress.     Breath sounds: Normal breath sounds.  Abdominal:     General: Bowel sounds are normal.     Palpations: Abdomen is soft.     Tenderness: There is no abdominal tenderness.  Musculoskeletal:        General: Normal range of motion.     Cervical back: Normal range of motion and neck supple.  Lymphadenopathy:     Cervical: No cervical adenopathy.  Skin:    General: Skin is warm and dry.  Neurological:     Mental Status: He is alert and oriented to person, place, and time.  Psychiatric:        Behavior: Behavior normal.        Thought Content: Thought content normal.        Judgment: Judgment normal.    BP 94/61   Pulse 78   Temp (!) 97.1 F (36.2 C) (Temporal)   Ht 5\' 8"  (1.727 m)   Wt 205 lb (93 kg)   SpO2 95%   BMI 31.17 kg/m     HGBA1c 5.8%      Assessment & Plan:   [name] comes in today with chief complaint of No chief complaint on file.   Diagnosis and orders addressed:  1. Hypertension associated with diabetes (HCC) Low sodium diet - CBC with Differential/Platelet - CMP14+EGFR - metoprolol  succinate (TOPROL -XL) 50 MG 24 hr tablet; Take 1 tablet (50 mg total) by mouth daily. Take with or immediately following a meal.  Dispense: 90 tablet; Refill: 1 - spironolactone  (ALDACTONE ) 25 MG tablet; Take 1 tablet (25 mg total) by mouth daily.  Dispense: 90  tablet; Refill: 1  2. Hyperlipidemia associated with type 2 diabetes mellitus (HCC) Low fat diet - Lipid panel - gemfibrozil  (LOPID ) 600 MG tablet; Take 1 tablet (600 mg total) by mouth 2 (two) times daily before a meal.  Dispense: 180 tablet; Refill: 1 - rosuvastatin  (CRESTOR ) 10 MG tablet; Take 1 tablet (10 mg total) by  mouth daily.  Dispense: 90 tablet; Refill: 1  3. Diabetes mellitus treated with oral medication (HCC) Continue to watch carbs in diet - Bayer DCA Hb A1c Waived - dapagliflozin  propanediol (FARXIGA ) 10 MG TABS tablet; Take 1 tablet (10 mg total) by mouth daily before breakfast.  Dispense: 90 tablet; Refill: 1 - Dulaglutide  (TRULICITY ) 1.5 MG/0.5ML SOAJ; Inject 1.5 mg into the skin once a week.  Dispense: 6 mL; Refill: 3 - pioglitazone  (ACTOS ) 45 MG tablet; Take 1 tablet (45 mg total) by mouth daily.  Dispense: 90 tablet; Refill: 1  4. Long-term current use of injectable noninsulin antidiabetic medication  5. Neuropathy Do not go barefooted - amitriptyline  (ELAVIL ) 150 MG tablet; Take 1 tablet (150 mg total) by mouth at bedtime.  Dispense: 90 tablet; Refill: 1 - gabapentin  (NEURONTIN ) 300 MG capsule; Take 2-3 capsules (600-900 mg total) by mouth 3 (three) times daily.  Dispense: 240 capsule; Refill: 1  6. Primary insomnia Bedtime routine  7. BMI 33.0-33.9,adult Discussed diet and exercise for person with BMI >25 Will recheck weight in 3-6 months    Labs pending Health Maintenance reviewed Diet and exercise encouraged  Follow up plan: 3 months   Mary-Margaret Gaylyn Keas, FNP

## 2023-12-07 LAB — CMP14+EGFR
ALT: 14 IU/L (ref 0–44)
AST: 18 IU/L (ref 0–40)
Albumin: 4.1 g/dL (ref 3.9–4.9)
Alkaline Phosphatase: 65 IU/L (ref 44–121)
BUN/Creatinine Ratio: 8 — ABNORMAL LOW (ref 10–24)
BUN: 10 mg/dL (ref 8–27)
Bilirubin Total: 0.6 mg/dL (ref 0.0–1.2)
CO2: 22 mmol/L (ref 20–29)
Calcium: 9.2 mg/dL (ref 8.6–10.2)
Chloride: 99 mmol/L (ref 96–106)
Creatinine, Ser: 1.27 mg/dL (ref 0.76–1.27)
Globulin, Total: 2.1 g/dL (ref 1.5–4.5)
Glucose: 91 mg/dL (ref 70–99)
Potassium: 4.6 mmol/L (ref 3.5–5.2)
Sodium: 137 mmol/L (ref 134–144)
Total Protein: 6.2 g/dL (ref 6.0–8.5)
eGFR: 62 mL/min/{1.73_m2} (ref >59)

## 2023-12-07 LAB — CBC WITH DIFFERENTIAL/PLATELET
Basophils Absolute: 0.1 10*3/uL (ref 0.0–0.2)
Basos: 1 %
EOS (ABSOLUTE): 0.1 10*3/uL (ref 0.0–0.4)
Eos: 2 %
Hematocrit: 41.1 % (ref 37.5–51.0)
Hemoglobin: 13.5 g/dL (ref 13.0–17.7)
Immature Grans (Abs): 0 10*3/uL (ref 0.0–0.1)
Immature Granulocytes: 0 %
Lymphocytes Absolute: 1.9 10*3/uL (ref 0.7–3.1)
Lymphs: 34 %
MCH: 33.2 pg — ABNORMAL HIGH (ref 26.6–33.0)
MCHC: 32.8 g/dL (ref 31.5–35.7)
MCV: 101 fL — ABNORMAL HIGH (ref 79–97)
Monocytes Absolute: 0.5 10*3/uL (ref 0.1–0.9)
Monocytes: 9 %
Neutrophils Absolute: 2.9 10*3/uL (ref 1.4–7.0)
Neutrophils: 54 %
Platelets: 187 10*3/uL (ref 150–450)
RBC: 4.07 x10E6/uL — ABNORMAL LOW (ref 4.14–5.80)
RDW: 12.9 % (ref 11.6–15.4)
WBC: 5.4 10*3/uL (ref 3.4–10.8)

## 2023-12-07 LAB — LIPID PANEL
Cholesterol, Total: 116 mg/dL (ref 100–199)
HDL: 31 mg/dL — ABNORMAL LOW (ref >39)
LDL CALC COMMENT:: 3.7 ratio (ref 0.0–5.0)
LDL Chol Calc (NIH): 68 mg/dL (ref 0–99)
Triglycerides: 87 mg/dL (ref 0–149)
VLDL Cholesterol Cal: 17 mg/dL (ref 5–40)

## 2024-01-30 ENCOUNTER — Other Ambulatory Visit: Payer: Self-pay | Admitting: Nurse Practitioner

## 2024-01-30 DIAGNOSIS — G629 Polyneuropathy, unspecified: Secondary | ICD-10-CM

## 2024-02-24 ENCOUNTER — Other Ambulatory Visit: Payer: Self-pay | Admitting: Nurse Practitioner

## 2024-02-24 DIAGNOSIS — G629 Polyneuropathy, unspecified: Secondary | ICD-10-CM

## 2024-03-06 ENCOUNTER — Ambulatory Visit: Admitting: Nurse Practitioner

## 2024-03-06 ENCOUNTER — Encounter: Payer: Self-pay | Admitting: Nurse Practitioner

## 2024-03-06 VITALS — BP 92/51 | HR 79 | Temp 96.7°F | Ht 68.0 in | Wt 211.0 lb

## 2024-03-06 DIAGNOSIS — E1169 Type 2 diabetes mellitus with other specified complication: Secondary | ICD-10-CM | POA: Diagnosis not present

## 2024-03-06 DIAGNOSIS — G629 Polyneuropathy, unspecified: Secondary | ICD-10-CM | POA: Diagnosis not present

## 2024-03-06 DIAGNOSIS — E1159 Type 2 diabetes mellitus with other circulatory complications: Secondary | ICD-10-CM | POA: Diagnosis not present

## 2024-03-06 DIAGNOSIS — E785 Hyperlipidemia, unspecified: Secondary | ICD-10-CM

## 2024-03-06 DIAGNOSIS — Z7984 Long term (current) use of oral hypoglycemic drugs: Secondary | ICD-10-CM

## 2024-03-06 DIAGNOSIS — E119 Type 2 diabetes mellitus without complications: Secondary | ICD-10-CM

## 2024-03-06 DIAGNOSIS — I152 Hypertension secondary to endocrine disorders: Secondary | ICD-10-CM

## 2024-03-06 LAB — BAYER DCA HB A1C WAIVED: HB A1C (BAYER DCA - WAIVED): 5.1 % (ref 4.8–5.6)

## 2024-03-06 LAB — LIPID PANEL

## 2024-03-06 MED ORDER — ROSUVASTATIN CALCIUM 10 MG PO TABS: 10.0000 mg | ORAL_TABLET | Freq: Every day | ORAL | 1 refills | Status: AC

## 2024-03-06 MED ORDER — GEMFIBROZIL 600 MG PO TABS: 600.0000 mg | ORAL_TABLET | Freq: Two times a day (BID) | ORAL | 1 refills | Status: AC

## 2024-03-06 MED ORDER — SPIRONOLACTONE 25 MG PO TABS: 25.0000 mg | ORAL_TABLET | Freq: Every day | ORAL | 1 refills | Status: AC

## 2024-03-06 MED ORDER — GABAPENTIN 300 MG PO CAPS: 600.0000 mg | ORAL_CAPSULE | Freq: Three times a day (TID) | ORAL | 1 refills | Status: DC

## 2024-03-06 MED ORDER — METOPROLOL SUCCINATE ER 50 MG PO TB24: 50.0000 mg | ORAL_TABLET | Freq: Every day | ORAL | 1 refills | Status: AC

## 2024-03-06 MED ORDER — PIOGLITAZONE HCL 45 MG PO TABS: 45.0000 mg | ORAL_TABLET | Freq: Every day | ORAL | 1 refills | Status: AC

## 2024-03-06 MED ORDER — TRULICITY 1.5 MG/0.5ML ~~LOC~~ SOAJ: 1.5000 mg | SUBCUTANEOUS | 3 refills | Status: AC

## 2024-03-06 MED ORDER — DAPAGLIFLOZIN PROPANEDIOL 10 MG PO TABS: 10.0000 mg | ORAL_TABLET | Freq: Every day | ORAL | 1 refills | Status: AC

## 2024-03-06 MED ORDER — AMITRIPTYLINE HCL 150 MG PO TABS: 150.0000 mg | ORAL_TABLET | Freq: Every day | ORAL | 1 refills | Status: AC

## 2024-03-06 NOTE — Progress Notes (Signed)
 Subjective:    Patient ID: Shawn Malone, male    DOB: 1955-12-02, 68 y.o.   MRN: [id number]   Chief Complaint: medical management of chronic issues     HPI:  Shawn Malone is a 68 y.o. who identifies as a male who was assigned male at birth.   Social history: Lives with: girlfriend Work history: retired   Water engineer in today for follow up of the following chronic medical issues:  1. Hypertension associated with diabetes (HCC) No c/o chest pain, sob or headache. Does not check blood pressure at home. BP Readings from Last 3 Encounters:  12/06/23 94/61  09/17/23 99/65  06/20/23 106/67     2. Hyperlipidemia associated with type 2 diabetes mellitus (HCC) Does not watch diet and does no exercise. Lab Results  Component Value Date   CHOL 116 12/06/2023   HDL 31 (L) 12/06/2023   LDLCALC 68 12/06/2023   TRIG 87 12/06/2023   CHOLHDL 3.7 12/06/2023   The ASCVD Risk score (Arnett DK, et al., 2019) failed to calculate for the following reasons:   The valid total cholesterol range is 130 to 320 mg/dL    3. Diabetes mellitus treated with oral medication (HCC) Fasting blood sugars are running around 110-140-  NO LOW BLOOD SUGARS Lab Results  Component Value Date   HGBA1C 5.4 12/06/2023     4. Long-term current use of injectable noninsulin antidiabetic medication Is on trulicity  and is doing well. Denies any medication side effects  5. Neuropathy Is on neurontin  TID and elavil , is ding well. Still has some burning in bil feet  6. Primary insomnia The elavil  seems to help him with his sleep issues  7. BMI 33.0-33.9,adult Weight is up 6 lbs Wt Readings from Last 3 Encounters:  03/06/24 211 lb (95.7 kg)  12/06/23 205 lb (93 kg)  09/17/23 213 lb (96.6 kg)   BMI Readings from Last 3 Encounters:  03/06/24 32.08 kg/m  12/06/23 31.17 kg/m  09/17/23 32.39 kg/m         New complaints: None today  Allergies  Allergen Reactions   Metformin  And Related      Stomach and feeling bad   Outpatient Encounter Medications as of 03/06/2024  Medication Sig   amitriptyline  (ELAVIL ) 150 MG tablet Take 1 tablet (150 mg total) by mouth at bedtime.   aspirin 325 MG EC tablet Take 325 mg by mouth daily.   dapagliflozin  propanediol (FARXIGA ) 10 MG TABS tablet Take 1 tablet (10 mg total) by mouth daily before breakfast.   Dulaglutide  (TRULICITY ) 1.5 MG/0.5ML SOAJ Inject 1.5 mg into the skin once a week.   gabapentin  (NEURONTIN ) 300 MG capsule TAKE 2-3 CAPSULES (600-900 MG TOTAL) BY MOUTH 3 (THREE) TIMES DAILY.   gemfibrozil  (LOPID ) 600 MG tablet Take 1 tablet (600 mg total) by mouth 2 (two) times daily before a meal.   metoprolol  succinate (TOPROL -XL) 50 MG 24 hr tablet Take 1 tablet (50 mg total) by mouth daily. Take with or immediately following a meal.   pioglitazone  (ACTOS ) 45 MG tablet Take 1 tablet (45 mg total) by mouth daily.   rosuvastatin  (CRESTOR ) 10 MG tablet Take 1 tablet (10 mg total) by mouth daily.   sildenafil  (REVATIO ) 20 MG tablet Take 2 tablets (40 mg total) by mouth daily as needed.   spironolactone  (ALDACTONE ) 25 MG tablet Take 1 tablet (25 mg total) by mouth daily.   No facility-administered encounter medications on file as of 03/06/2024.    Past Surgical History:  Procedure Laterality Date   None     As of 08/10/16    Family History  Problem Relation Age of Onset   Cancer Mother    Colon cancer Brother 86   Lung cancer Brother    Liver disease Neg Hx       Controlled substance contract: n/a     Review of Systems  Constitutional:  Negative for diaphoresis.  Eyes:  Negative for pain.  Respiratory:  Negative for shortness of breath.   Cardiovascular:  Negative for chest pain, palpitations and leg swelling.  Gastrointestinal:  Negative for abdominal pain.  Endocrine: Negative for polydipsia.  Skin:  Negative for rash.  Neurological:  Negative for dizziness, weakness and headaches.  Hematological:  Does not bruise/bleed  easily.  All other systems reviewed and are negative.      Objective:   Physical Exam Vitals and nursing note reviewed.  Constitutional:      Appearance: Normal appearance. He is well-developed.  HENT:     Head: Normocephalic.     Nose: Nose normal.     Mouth/Throat:     Mouth: Mucous membranes are moist.     Pharynx: Oropharynx is clear.  Eyes:     Pupils: Pupils are equal, round, and reactive to light.  Neck:     Thyroid: No thyroid mass or thyromegaly.     Vascular: No carotid bruit or JVD.     Trachea: Phonation normal.  Cardiovascular:     Rate and Rhythm: Normal rate and regular rhythm.  Pulmonary:     Effort: Pulmonary effort is normal. No respiratory distress.     Breath sounds: Normal breath sounds.  Abdominal:     General: Bowel sounds are normal.     Palpations: Abdomen is soft.     Tenderness: There is no abdominal tenderness.  Musculoskeletal:        General: Normal range of motion.     Cervical back: Normal range of motion and neck supple.  Lymphadenopathy:     Cervical: No cervical adenopathy.  Skin:    General: Skin is warm and dry.  Neurological:     Mental Status: He is alert and oriented to person, place, and time.  Psychiatric:        Behavior: Behavior normal.        Thought Content: Thought content normal.        Judgment: Judgment normal.    BP (!) 92/51   Pulse 79   Temp (!) 96.7 F (35.9 C) (Temporal)   Ht 5' 8 (1.727 m)   Wt 211 lb (95.7 kg)   SpO2 94%   BMI 32.08 kg/m    HGBA1c 5.1%      Assessment & Plan:   [name] comes in today with chief complaint of medical management of chronic issues    Diagnosis and orders addressed:  1. Hypertension associated with diabetes (HCC) Low sodium diet - CBC with Differential/Platelet - CMP14+EGFR - metoprolol  succinate (TOPROL -XL) 50 MG 24 hr tablet; Take 1 tablet (50 mg total) by mouth daily. Take with or immediately following a meal.  Dispense: 90 tablet; Refill: 1 -  spironolactone  (ALDACTONE ) 25 MG tablet; Take 1 tablet (25 mg total) by mouth daily.  Dispense: 90 tablet; Refill: 1  2. Hyperlipidemia associated with type 2 diabetes mellitus (HCC) Low fat diet - Lipid panel - gemfibrozil  (LOPID ) 600 MG tablet; Take 1 tablet (600 mg total) by mouth 2 (two) times daily before a meal.  Dispense:  180 tablet; Refill: 1 - rosuvastatin  (CRESTOR ) 10 MG tablet; Take 1 tablet (10 mg total) by mouth daily.  Dispense: 90 tablet; Refill: 1  3. Diabetes mellitus treated with oral medication (HCC) Continue to watch carbs in diet - Bayer DCA Hb A1c Waived - dapagliflozin  propanediol (FARXIGA ) 10 MG TABS tablet; Take 1 tablet (10 mg total) by mouth daily before breakfast.  Dispense: 90 tablet; Refill: 1 - Dulaglutide  (TRULICITY ) 1.5 MG/0.5ML SOAJ; Inject 1.5 mg into the skin once a week.  Dispense: 6 mL; Refill: 3 - pioglitazone  (ACTOS ) 45 MG tablet; Take 1 tablet (45 mg total) by mouth daily.  Dispense: 90 tablet; Refill: 1  4. Long-term current use of injectable noninsulin antidiabetic medication  5. Neuropathy Do not go barefooted - amitriptyline  (ELAVIL ) 150 MG tablet; Take 1 tablet (150 mg total) by mouth at bedtime.  Dispense: 90 tablet; Refill: 1 - gabapentin  (NEURONTIN ) 300 MG capsule; Take 2-3 capsules (600-900 mg total) by mouth 3 (three) times daily.  Dispense: 240 capsule; Refill: 1  6. Primary insomnia Bedtime routine  7. BMI 33.0-33.9,adult Discussed diet and exercise for person with BMI >25 Will recheck weight in 3-6 months    Labs pending Health Maintenance reviewed Diet and exercise encouraged  Follow up plan: 3 months   Mary-Margaret Gladis, FNP

## 2024-03-07 LAB — CBC WITH DIFFERENTIAL/PLATELET
Basophils Absolute: 0.1 x10E3/uL (ref 0.0–0.2)
Basos: 1 %
EOS (ABSOLUTE): 0.1 x10E3/uL (ref 0.0–0.4)
Eos: 2 %
Hematocrit: 41.7 % (ref 37.5–51.0)
Hemoglobin: 13.9 g/dL (ref 13.0–17.7)
Immature Grans (Abs): 0 x10E3/uL (ref 0.0–0.1)
Immature Granulocytes: 0 %
Lymphocytes Absolute: 2 x10E3/uL (ref 0.7–3.1)
Lymphs: 36 %
MCH: 34.6 pg — ABNORMAL HIGH (ref 26.6–33.0)
MCHC: 33.3 g/dL (ref 31.5–35.7)
MCV: 104 fL — ABNORMAL HIGH (ref 79–97)
Monocytes Absolute: 0.6 x10E3/uL (ref 0.1–0.9)
Monocytes: 10 %
Neutrophils Absolute: 2.7 x10E3/uL (ref 1.4–7.0)
Neutrophils: 51 %
Platelets: 212 x10E3/uL (ref 150–450)
RBC: 4.02 x10E6/uL — ABNORMAL LOW (ref 4.14–5.80)
RDW: 13.7 % (ref 11.6–15.4)
WBC: 5.5 x10E3/uL (ref 3.4–10.8)

## 2024-03-07 LAB — CMP14+EGFR
ALT: 9 IU/L (ref 0–44)
AST: 13 IU/L (ref 0–40)
Albumin: 4 g/dL (ref 3.9–4.9)
Alkaline Phosphatase: 58 IU/L (ref 44–121)
BUN/Creatinine Ratio: 7 — ABNORMAL LOW (ref 10–24)
BUN: 9 mg/dL (ref 8–27)
Bilirubin Total: 0.6 mg/dL (ref 0.0–1.2)
CO2: 23 mmol/L (ref 20–29)
Calcium: 9.2 mg/dL (ref 8.6–10.2)
Chloride: 100 mmol/L (ref 96–106)
Creatinine, Ser: 1.21 mg/dL (ref 0.76–1.27)
Globulin, Total: 2.4 g/dL (ref 1.5–4.5)
Glucose: 89 mg/dL (ref 70–99)
Potassium: 5.1 mmol/L (ref 3.5–5.2)
Sodium: 137 mmol/L (ref 134–144)
Total Protein: 6.4 g/dL (ref 6.0–8.5)
eGFR: 66 mL/min/1.73 (ref >59)

## 2024-03-07 LAB — LIPID PANEL
Chol/HDL Ratio: 5 ratio (ref 0.0–5.0)
Cholesterol, Total: 144 mg/dL (ref 100–199)
HDL: 29 mg/dL — ABNORMAL LOW (ref >39)
LDL Chol Calc (NIH): 92 mg/dL (ref 0–99)
Triglycerides: 125 mg/dL (ref 0–149)
VLDL Cholesterol Cal: 23 mg/dL (ref 5–40)

## 2024-03-08 LAB — MICROALBUMIN / CREATININE URINE RATIO
Creatinine, Urine: 49.9 mg/dL
Microalb/Creat Ratio: <6 mg/g{creat} (ref 0–29)
Microalbumin, Urine: <3 ug/mL

## 2024-03-09 ENCOUNTER — Ambulatory Visit: Payer: Self-pay | Admitting: Nurse Practitioner

## 2024-05-16 ENCOUNTER — Other Ambulatory Visit: Payer: Self-pay | Admitting: Nurse Practitioner

## 2024-05-16 DIAGNOSIS — G629 Polyneuropathy, unspecified: Secondary | ICD-10-CM

## 2024-06-02 ENCOUNTER — Ambulatory Visit: Payer: Medicare Other

## 2024-07-05 ENCOUNTER — Other Ambulatory Visit: Payer: Self-pay | Admitting: Nurse Practitioner

## 2024-07-05 DIAGNOSIS — G629 Polyneuropathy, unspecified: Secondary | ICD-10-CM

## 2024-07-21 ENCOUNTER — Ambulatory Visit: Payer: Self-pay

## 2024-07-27 ENCOUNTER — Ambulatory Visit (INDEPENDENT_AMBULATORY_CARE_PROVIDER_SITE_OTHER)

## 2024-07-27 VITALS — BP 97/54 | HR 83 | Temp 98.8°F | Ht 68.0 in | Wt 207.6 lb

## 2024-07-27 DIAGNOSIS — Z0000 Encounter for general adult medical examination without abnormal findings: Secondary | ICD-10-CM | POA: Diagnosis not present

## 2024-07-27 NOTE — Patient Instructions (Signed)
 Mr. Gladman,  Thank you for taking the time for your Medicare Wellness Visit. I appreciate your continued commitment to your health goals. Please review the care plan we discussed, and feel free to reach out if I can assist you further.  Please note that Annual Wellness Visits do not include a physical exam. Some assessments may be limited, especially if the visit was conducted virtually. If needed, we may recommend an in-person follow-up with your provider.  Ongoing Care Seeing your primary care provider every 3 to 6 months helps us  monitor your health and provide consistent, personalized care.   Referrals If a referral was made during today's visit and you haven't received any updates within two weeks, please contact the referred provider directly to check on the status.  Recommended Screenings:  Health Maintenance  Topic Date Due   DTaP/Tdap/Td vaccine (1 - Tdap) Never done   Zoster (Shingles) Vaccine (1 of 2) Never done   COVID-19 Vaccine (1 - 2025-26 season) Never done   Eye exam for diabetics  04/04/2024   Medicare Annual Wellness Visit  05/30/2024   Colon Cancer Screening  09/16/2024*   Flu Shot  10/27/2024*   Pneumococcal Vaccine for age over 25 (1 of 2 - PCV) 03/06/2025*   Hemoglobin A1C  09/06/2024   Yearly kidney function blood test for diabetes  03/06/2025   Yearly kidney health urinalysis for diabetes  03/06/2025   Complete foot exam   03/06/2025   Hepatitis C Screening  Completed   Meningitis B Vaccine  Aged Out  *Topic was postponed. The date shown is not the original due date.       07/27/2024   12:04 PM  Advanced Directives  Does Patient Have a Medical Advance Directive? No  Would patient like information on creating a medical advance directive? No - Patient declined    Vision: Annual vision screenings are recommended for early detection of glaucoma, cataracts, and diabetic retinopathy. These exams can also reveal signs of chronic conditions such as diabetes  and high blood pressure.  Dental: Annual dental screenings help detect early signs of oral cancer, gum disease, and other conditions linked to overall health, including heart disease and diabetes.  Please see the attached documents for additional preventive care recommendations.

## 2024-07-27 NOTE — Progress Notes (Signed)
 "  Chief Complaint  Patient presents with   Medicare Wellness     Subjective:   Shawn Malone is a 68 y.o. male who presents for a Medicare Annual Wellness Visit.  Visit info / Clinical Intake: Medicare Wellness Visit Type:: Subsequent Annual Wellness Visit Persons participating in visit and providing information:: patient Medicare Wellness Visit Mode:: In-person (required for WTM) Interpreter Needed?: No Pre-visit prep was completed: yes AWV questionnaire completed by patient prior to visit?: no Living arrangements:: other (care taker, Shawn Malone) Patient's Overall Health Status Rating: very good Typical amount of pain: none Does pain affect daily life?: no Are you currently prescribed opioids?: no  Dietary Habits and Nutritional Risks How many meals a day?: 2 Eats fruit and vegetables daily?: yes Most meals are obtained by: preparing own meals In the last 2 weeks, have you had any of the following?: (!) nausea, vomiting, diarrhea Diabetic:: (!) yes Any non-healing wounds?: no How often do you check your BS?: 1 Would you like to be referred to a Nutritionist or for Diabetic Management? : no  Functional Status Activities of Daily Living (to include ambulation/medication): Independent Ambulation: Independent Medication Administration: Independent Home Management (perform basic housework or laundry): Independent Manage your own finances?: yes Primary transportation is: driving Concerns about vision?: no *vision screening is required for WTM* (last ov year ago) Concerns about hearing?: (!) yes Uses hearing aids?: no  Fall Screening Falls in the past year?: 0 Was there an injury with Fall?: 0  Fall Risk Patient at Risk for Falls Due to: No Fall Risks Fall risk Follow up: Falls evaluation completed; Education provided  Home and Transportation Safety: All rugs have non-skid backing?: yes All stairs or steps have railings?: (!) no Grab bars in the bathtub or shower?:  (!) no Have non-skid surface in bathtub or shower?: (!) no Good home lighting?: yes Regular seat belt use?: yes Hospital stays in the last year:: no  Cognitive Assessment Difficulty concentrating, remembering, or making decisions? : yes Will 6CIT or Mini Cog be Completed: yes What year is it?: 0 points What month is it?: 0 points Give patient an address phrase to remember (5 components): [address]  [place], [place] About what time is it?: 0 points Count backwards from 20 to 1: 0 points Say the months of the year in reverse: 0 points Repeat the address phrase from earlier: 0 points 6 CIT Score: 0 points  Advance Directives (For Healthcare) Does Patient Have a Medical Advance Directive?: No Would patient like information on creating a medical advance directive?: No - Patient declined  Reviewed/Updated  Reviewed/Updated: Reviewed All (Medical, Surgical, Family, Medications, Allergies, Care Teams, Patient Goals); Surgical History; Medical History; Family History; Medications; Allergies; Care Teams; Patient Goals    Allergies (verified) Metformin  and related   Current Medications (verified) Outpatient Encounter Medications as of 07/27/2024  Medication Sig   amitriptyline  (ELAVIL ) 150 MG tablet Take 1 tablet (150 mg total) by mouth at bedtime.   aspirin 325 MG EC tablet Take 325 mg by mouth daily.   dapagliflozin  propanediol (FARXIGA ) 10 MG TABS tablet Take 1 tablet (10 mg total) by mouth daily before breakfast.   Dulaglutide  (TRULICITY ) 1.5 MG/0.5ML SOAJ Inject 1.5 mg into the skin once a week.   gabapentin  (NEURONTIN ) 300 MG capsule TAKE 2-3 CAPSULES (600-900 MG TOTAL) BY MOUTH 3 (THREE) TIMES DAILY.   gemfibrozil  (LOPID ) 600 MG tablet Take 1 tablet (600 mg total) by mouth 2 (two) times daily before a meal.  metoprolol  succinate (TOPROL -XL) 50 MG 24 hr tablet Take 1 tablet (50 mg total) by mouth daily. Take with or immediately following a meal.   pioglitazone  (ACTOS ) 45 MG  tablet Take 1 tablet (45 mg total) by mouth daily.   rosuvastatin  (CRESTOR ) 10 MG tablet Take 1 tablet (10 mg total) by mouth daily.   sildenafil  (REVATIO ) 20 MG tablet Take 2 tablets (40 mg total) by mouth daily as needed.   spironolactone  (ALDACTONE ) 25 MG tablet Take 1 tablet (25 mg total) by mouth daily.   No facility-administered encounter medications on file as of 07/27/2024.    History: Past Medical History:  Diagnosis Date   Diabetes mellitus without complication (HCC)    Elevated bilirubin    Extremity edema    Past Surgical History:  Procedure Laterality Date   None     As of 08/10/16   Family History  Problem Relation Age of Onset   Cancer Mother    Colon cancer Brother 75   Lung cancer Brother    Liver disease Neg Hx    Social History   Occupational History   Occupation: Retired  Tobacco Use   Smoking status: Former    Current packs/day: 0.00    Types: Cigarettes    Quit date: 05/22/1983    Years since quitting: 41.2   Smokeless tobacco: Never  Vaping Use   Vaping status: Never Used  Substance and Sexual Activity   Alcohol use: No    Alcohol/week: 30.0 standard drinks of alcohol    Types: 30 Cans of beer per week    Comment: Currently no ETOH 08/10/16; Previously drank significantly   Drug use: No   Sexual activity: Yes    Partners: Female   Tobacco Counseling Counseling given: Yes  SDOH Screenings   Food Insecurity: No Food Insecurity (07/27/2024)  Housing: Unknown (07/27/2024)  Transportation Needs: No Transportation Needs (07/27/2024)  Utilities: Not At Risk (07/27/2024)  Alcohol Screen: Low Risk (05/31/2023)  Depression (PHQ2-9): Low Risk (07/27/2024)  Financial Resource Strain: Low Risk (05/31/2023)  Physical Activity: Insufficiently Active (07/27/2024)  Social Connections: Moderately Isolated (07/27/2024)  Stress: No Stress Concern Present (07/27/2024)  Tobacco Use: Medium Risk (07/27/2024)  Health Literacy: Adequate Health Literacy  (07/27/2024)   See flowsheets for full screening details  Depression Screen PHQ 2 & 9 Depression Scale- Over the past 2 weeks, how often have you been bothered by any of the following problems? Little interest or pleasure in doing things: 0 Feeling down, depressed, or hopeless (PHQ Adolescent also includes...irritable): 0 PHQ-2 Total Score: 0     Goals Addressed             This Visit's Progress    Exercise 150 min/wk Moderate Activity   On track            Objective:    Today's Vitals   07/27/24 1159  BP: (!) 97/54  Pulse: 83  Temp: 98.8 F (37.1 C)  TempSrc: Oral  Weight: 207 lb 9.6 oz (94.2 kg)  Height: 5' 8 (1.727 m)   Body mass index is 31.57 kg/m.  Hearing/Vision screen No results found. Immunizations and Health Maintenance Health Maintenance  Topic Date Due   DTaP/Tdap/Td (1 - Tdap) Never done   Zoster Vaccines- Shingrix (1 of 2) Never done   COVID-19 Vaccine (1 - 2025-26 season) Never done   OPHTHALMOLOGY EXAM  04/04/2024   Colonoscopy  09/16/2024 (Originally 10/10/2002)   Influenza Vaccine  10/27/2024 (Originally 02/28/2024)   Pneumococcal Vaccine:  50+ Years (1 of 2 - PCV) 03/06/2025 (Originally 10/09/1974)   HEMOGLOBIN A1C  09/06/2024   Diabetic kidney evaluation - eGFR measurement  03/06/2025   Diabetic kidney evaluation - Urine ACR  03/06/2025   FOOT EXAM  03/06/2025   Medicare Annual Wellness (AWV)  07/27/2025   Hepatitis C Screening  Completed   Meningococcal B Vaccine  Aged Out        Assessment/Plan:  This is a routine wellness examination for [facility].  Patient Care Team: Shawn Mustard, FNP as PCP - General (Family Medicine) Shawn Lamar HERO, MD as Consulting Physician (Gastroenterology) Shawn Malone, RPH-CPP (Pharmacist)  I have personally reviewed and noted the following in the patients chart:   Medical and social history Use of alcohol, tobacco or illicit drugs  Current medications and supplements including opioid  prescriptions. Functional ability and status Nutritional status Physical activity Advanced directives List of other physicians Hospitalizations, surgeries, and ER visits in previous 12 months Vitals Screenings to include cognitive, depression, and falls Referrals and appointments  No orders of the defined types were placed in this encounter.  In addition, I have reviewed and discussed with patient certain preventive protocols, quality metrics, and best practice recommendations. A written personalized care plan for preventive services as well as general preventive health recommendations were provided to patient.   Shawn Malone, CMA   07/27/2024   Return in 1 year (on 07/27/2025).  After Visit Summary: (MyChart) Due to this being a telephonic visit, the after visit summary with patients personalized plan was offered to patient via MyChart   Nurse Notes: need Diabetic eye Exam pt is aware "

## 2024-09-07 ENCOUNTER — Ambulatory Visit: Payer: Self-pay | Admitting: Nurse Practitioner
